# Patient Record
Sex: Female | Born: 1975
Health system: Southern US, Community
[De-identification: ages and names within clinical notes are randomized; demographics above are authoritative.]

## PROBLEM LIST (undated history)

## (undated) DIAGNOSIS — O24419 Gestational diabetes mellitus in pregnancy, unspecified control: Secondary | ICD-10-CM

## (undated) DIAGNOSIS — R030 Elevated blood-pressure reading, without diagnosis of hypertension: Secondary | ICD-10-CM

## (undated) DIAGNOSIS — E785 Hyperlipidemia, unspecified: Secondary | ICD-10-CM

## (undated) HISTORY — DX: Hyperlipidemia, unspecified: E78.5

## (undated) HISTORY — DX: Elevated blood-pressure reading, without diagnosis of hypertension: R03.0

## (undated) HISTORY — DX: Gestational diabetes mellitus in pregnancy, unspecified control: O24.419

---

## 2015-02-09 ENCOUNTER — Encounter: Payer: Self-pay | Admitting: Primary Care

## 2015-02-09 ENCOUNTER — Ambulatory Visit (INDEPENDENT_AMBULATORY_CARE_PROVIDER_SITE_OTHER): Payer: BLUE CROSS/BLUE SHIELD | Admitting: Primary Care

## 2015-02-09 VITALS — BP 138/92 | HR 90 | Temp 98.1°F | Ht 68.25 in | Wt 194.1 lb

## 2015-02-09 DIAGNOSIS — R030 Elevated blood-pressure reading, without diagnosis of hypertension: Secondary | ICD-10-CM

## 2015-02-09 DIAGNOSIS — E785 Hyperlipidemia, unspecified: Secondary | ICD-10-CM

## 2015-02-09 DIAGNOSIS — Z131 Encounter for screening for diabetes mellitus: Secondary | ICD-10-CM | POA: Diagnosis not present

## 2015-02-09 DIAGNOSIS — R7303 Prediabetes: Secondary | ICD-10-CM | POA: Insufficient documentation

## 2015-02-09 LAB — HEMOGLOBIN A1C: HEMOGLOBIN A1C: 5.6 % (ref 4.6–6.5)

## 2015-02-09 NOTE — Progress Notes (Signed)
Subjective:    Patient ID: Holly [name], female    DOB: [birth year], 39 y.o.   MRN: [id number]  HPI  Holly [name] is a 39 year old female who presents today to establish care and discuss the problems mentioned below. Will obtain old records. She presents today with lab work (drawn 01/08/15) with her occupation (CBC, CMP, TSH, Lipid Panel)  1) Gestational Diabetes: Delivered October 2015. She's not been re-evaluated since delivery, but glucose on lab work is 88 fasting. She has a strong FH of diabetes and would like to be evaluated. She denies numbness/tingling to fingers or toes, polyuria, polydipsia, polyphagia, weakness, dizziness.   2) Hyperlipidemia: Lab work displays elevated cholesterol and LDL. Her diet consists of fast food, eating out, "junk food" with limited fruits and vegetables. She's not a big dessert eater. She mostly drinks juice, diet soda, regular sodas with one bottle of water daily. She began exercising and started a "squat challenge" 2 weeks ago. She does have access to gym and plans on attending more frequently.   3) Elevated Blood Pressure Readings: One year ago was told her BP was elevated. She does not recall having elevated blood pressure during her last pregnancy. She denies chest pain today, but does have a history of chest pain in the past and cannot remember exact symptoms. Occasional headaches when experiencing stress at work.  BP Readings from Last 3 Encounters:  02/09/15 138/92     Review of Systems  Constitutional: Negative for fatigue and unexpected weight change.  HENT: Negative for rhinorrhea.   Respiratory: Negative for cough and shortness of breath.   Cardiovascular: Negative for chest pain.  Gastrointestinal: Negative for diarrhea and constipation.  Genitourinary: Negative for dysuria and frequency.  Musculoskeletal: Negative for myalgias and arthralgias.  Skin: Negative for rash.  Allergic/Immunologic: Negative for environmental allergies.    Neurological: Negative for numbness.       Occasional headaches.  Hematological: Negative for adenopathy.  Psychiatric/Behavioral:       Denies concerns for anxiety or depression.       Past Medical History  Diagnosis Date  . Gestational diabetes   . Hyperlipidemia   . Elevated blood pressure (not hypertension)     History   Social History  . Marital Status: Married    Spouse Name: N/A  . Number of Children: N/A  . Years of Education: N/A   Occupational History  . Not on file.   Social History Main Topics  . Smoking status: Never Smoker   . Smokeless tobacco: Not on file  . Alcohol Use: 0.0 oz/week    0 Standard drinks or equivalent per week     Comment: social  . Drug Use: Not on file  . Sexual Activity: Not on file   Other Topics Concern  . Not on file   Social History Narrative   Married.   Works in Clinical biochemistcustomer service.   Three girls.   [place]ives in [place]   Enjoys relaxing.       History reviewed. No pertinent past surgical history.  Family History  Problem Relation Age of Onset  . Arthritis Mother   . Hypertension Mother   . Cancer Paternal Grandmother     Colon  . Stroke Paternal Grandmother   . Diabetes Father   . Diabetes Paternal Grandmother     No Known Allergies  No current outpatient prescriptions on file prior to visit.   No current facility-administered medications on file prior to visit.  BP 138/92 mmHg  Pulse 90  Temp(Src) 98.1 F (36.7 C) (Oral)  Ht 5' 8.25" (1.734 m)  Wt 194 lb 1.9 oz (88.052 kg)  BMI 29.28 kg/m2  SpO2 98%    Objective:   Physical Exam  Constitutional: She is oriented to person, place, and time. She appears well-developed.  HENT:  Right Ear: Tympanic membrane and ear canal normal.  Left Ear: Tympanic membrane and ear canal normal.  Nose: Nose normal.  Mouth/Throat: Oropharynx is clear and moist.  Eyes: EOM are normal. Pupils are equal, round, and reactive to light.  Neck: Neck supple. No  thyromegaly present.  Cardiovascular: Normal rate and regular rhythm.   Pulmonary/Chest: Effort normal and breath sounds normal.  Abdominal: Soft. Bowel sounds are normal.  Musculoskeletal: Normal range of motion.  Lymphadenopathy:    She has no cervical adenopathy.  Neurological: She is alert and oriented to person, place, and time. She has normal reflexes. No cranial nerve deficit. Coordination normal.  Skin: Skin is warm and dry.  Psychiatric: She has a normal mood and affect.          Assessment & Plan:

## 2015-02-09 NOTE — Assessment & Plan Note (Signed)
Slightly elevated today. Education provided that weight loss through healthy diet and exercise will help to reduce blood pressure. She is to follow up in one month for re-evaluation, if continued elevation, then will consider medication.

## 2015-02-09 NOTE — Progress Notes (Signed)
Pre visit review using our clinic review tool, if applicable. No additional management support is needed unless otherwise documented below in the visit note. 

## 2015-02-09 NOTE — Patient Instructions (Addendum)
Complete lab work prior to leaving today. I will notify you of your results. It is important that you improve your diet. Please limit fast food, juice, junk foot, etc. Increase your consumption of fresh fruits and vegetables. Be sure to drink plenty of water daily. Continue your efforts of exercise and be sure to get 30-45 min of moderate intensity exercise at least 5 days a week. Follow up in one month for re-evaluation of blood pressure. It was a pleasure to meet you today! Please don't hesitate to call me with any questions. Welcome to [facility]!

## 2015-02-09 NOTE — Assessment & Plan Note (Signed)
She presents with fasting labs drawn on 01/08/15. Total cholesterol 241, LDL 169, HDL 47. Unhealthy diet. We discussed the importance of a healthy diet and what that entails. She has started exercising and plans on continuing.  Follow up Lipid Panel in 6 months.

## 2015-02-09 NOTE — Assessment & Plan Note (Signed)
Reports gestational diabetes during last pregnancy. Her glucose on occupational labs report is 5888. Will screen today with A1C. Again, discussed importance of healthy diet and exercise.

## 2015-02-13 ENCOUNTER — Encounter: Payer: Self-pay | Admitting: Primary Care

## 2015-03-13 ENCOUNTER — Ambulatory Visit: Payer: BLUE CROSS/BLUE SHIELD | Admitting: Primary Care

## 2015-03-27 ENCOUNTER — Ambulatory Visit: Payer: BLUE CROSS/BLUE SHIELD | Admitting: Primary Care

## 2015-10-23 ENCOUNTER — Other Ambulatory Visit (HOSPITAL_COMMUNITY)
Admission: RE | Admit: 2015-10-23 | Discharge: 2015-10-23 | Disposition: A | Discharge status: Home / Self Care | Payer: BLUE CROSS/BLUE SHIELD | Source: Ambulatory Visit | Attending: Primary Care | Admitting: Primary Care

## 2015-10-23 ENCOUNTER — Encounter: Payer: Self-pay | Admitting: Primary Care

## 2015-10-23 ENCOUNTER — Ambulatory Visit (INDEPENDENT_AMBULATORY_CARE_PROVIDER_SITE_OTHER): Payer: BLUE CROSS/BLUE SHIELD | Admitting: Primary Care

## 2015-10-23 VITALS — BP 122/82 | HR 76 | Temp 98.6°F | Ht 68.0 in | Wt 194.8 lb

## 2015-10-23 DIAGNOSIS — Z124 Encounter for screening for malignant neoplasm of cervix: Secondary | ICD-10-CM

## 2015-10-23 DIAGNOSIS — N898 Other specified noninflammatory disorders of vagina: Secondary | ICD-10-CM | POA: Diagnosis not present

## 2015-10-23 DIAGNOSIS — Z1151 Encounter for screening for human papillomavirus (HPV): Secondary | ICD-10-CM | POA: Insufficient documentation

## 2015-10-23 DIAGNOSIS — E559 Vitamin D deficiency, unspecified: Secondary | ICD-10-CM

## 2015-10-23 DIAGNOSIS — E785 Hyperlipidemia, unspecified: Secondary | ICD-10-CM | POA: Diagnosis not present

## 2015-10-23 DIAGNOSIS — Z01419 Encounter for gynecological examination (general) (routine) without abnormal findings: Secondary | ICD-10-CM | POA: Insufficient documentation

## 2015-10-23 DIAGNOSIS — Z0000 Encounter for general adult medical examination without abnormal findings: Secondary | ICD-10-CM | POA: Diagnosis not present

## 2015-10-23 DIAGNOSIS — R030 Elevated blood-pressure reading, without diagnosis of hypertension: Secondary | ICD-10-CM

## 2015-10-23 LAB — CBC
HCT: 40.5 % (ref 36.0–46.0)
Hemoglobin: 13.1 g/dL (ref 12.0–15.0)
MCHC: 32.5 g/dL (ref 30.0–36.0)
MCV: 91.7 fl (ref 78.0–100.0)
PLATELETS: 204 10*3/uL (ref 150.0–400.0)
RBC: 4.41 Mil/uL (ref 3.87–5.11)
RDW: 12.6 % (ref 11.5–15.5)
WBC: 7.2 10*3/uL (ref 4.0–10.5)

## 2015-10-23 LAB — LIPID PANEL
CHOL/HDL RATIO: 5
CHOLESTEROL: 220 mg/dL — AB (ref 0–200)
HDL: 42.5 mg/dL (ref >39.00)
LDL CALC: 152 mg/dL — AB (ref 0–99)
NonHDL: 177.1
TRIGLYCERIDES: 124 mg/dL (ref 0.0–149.0)
VLDL: 24.8 mg/dL (ref 0.0–40.0)

## 2015-10-23 LAB — COMPREHENSIVE METABOLIC PANEL
ALBUMIN: 4.1 g/dL (ref 3.5–5.2)
ALT: 15 U/L (ref 0–35)
AST: 16 U/L (ref 0–37)
Alkaline Phosphatase: 55 U/L (ref 39–117)
BUN: 10 mg/dL (ref 6–23)
CALCIUM: 9.4 mg/dL (ref 8.4–10.5)
CO2: 30 meq/L (ref 19–32)
CREATININE: 0.95 mg/dL (ref 0.40–1.20)
Chloride: 101 mEq/L (ref 96–112)
GFR: 84.36 mL/min (ref >60.00)
Glucose, Bld: 98 mg/dL (ref 70–99)
POTASSIUM: 3.9 meq/L (ref 3.5–5.1)
SODIUM: 138 meq/L (ref 135–145)
Total Bilirubin: 0.7 mg/dL (ref 0.2–1.2)
Total Protein: 7.5 g/dL (ref 6.0–8.3)

## 2015-10-23 LAB — VITAMIN D 25 HYDROXY (VIT D DEFICIENCY, FRACTURES): VITD: 16.01 ng/mL — ABNORMAL LOW (ref 30.00–100.00)

## 2015-10-23 NOTE — Progress Notes (Signed)
Pre visit review using our clinic review tool, if applicable. No additional management support is needed unless otherwise documented below in the visit note. 

## 2015-10-23 NOTE — Patient Instructions (Addendum)
We will notify you of your pap results once received.   Complete lab work prior to leaving today. I will notify you of your results once received.   It is important that you improve your diet. Please limit carbohydrates in the form of white bread, rice, pasta, fast food, fried foods, sugary drinks, etc. Increase your consumption of fresh fruits and vegetables.  You need to consume about 2 liters of water daily.  Start exercising. You should be getting 1 hour of moderate intensity exercise 5 days weekly.  Follow up in 1 year for repeat physical or sooner if needed.  It was a pleasure to see you today!

## 2015-10-23 NOTE — Progress Notes (Signed)
Subjective:    Patient ID: Holly [name], female    DOB: [birth year], 40 y.o.   MRN: [id number]  HPI  Holly [name] is a 40 year old female who presents today for complete physical.  Immunizations: -Tetanus: Completed in October 2015 -Influenza: Declines   Diet: Endorses a fair diet Breakfast: Skips Lunch: Eats at 2-3 pm. Left overs. Dinner: Home cooked meals (chicken, pasta, little veggies, starches) Eats out 2-3 times weekly. Snacks: Occasionally, chips, junk food. Desserts: None Beverages: Juice, little water, diet sodas (1 can daily).  Exercise: She does not currently exercise. Eye exam: Completed 1 year ago. Dental exam: Completed October 2016 Pap Smear: Due today    Review of Systems  Constitutional: Negative for unexpected weight change.  HENT: Negative for rhinorrhea.   Respiratory: Negative for cough and shortness of breath.   Cardiovascular: Negative for chest pain.  Gastrointestinal: Negative for diarrhea and constipation.  Genitourinary:       IUD in place. Will spot occasionally.   Musculoskeletal: Negative for myalgias and arthralgias.  Skin: Negative for rash.  Allergic/Immunologic: Negative for environmental allergies.  Neurological: Negative for dizziness, numbness and headaches.  Psychiatric/Behavioral:       Denies concerns for anxiety or depression        Past Medical History  Diagnosis Date  . Gestational diabetes   . Hyperlipidemia   . Elevated blood pressure (not hypertension)     Social History   Social History  . Marital Status: Married    Spouse Name: N/A  . Number of Children: N/A  . Years of Education: N/A   Occupational History  . Not on file.   Social History Main Topics  . Smoking status: Never Smoker   . Smokeless tobacco: Not on file  . Alcohol Use: 0.0 oz/week    0 Standard drinks or equivalent per week     Comment: social  . Drug Use: Not on file  . Sexual Activity: Not on file   Other Topics Concern  . Not on  file   Social History Narrative   Married.   Works in Clinical biochemistcustomer service.   Three girls.   [place]ives in [place]   Enjoys relaxing.       No past surgical history on file.  Family History  Problem Relation Age of Onset  . Arthritis Mother   . Hypertension Mother   . Cancer Paternal Grandmother     Colon  . Stroke Paternal Grandmother   . Diabetes Father   . Diabetes Paternal Grandmother     No Known Allergies  No current outpatient prescriptions on file prior to visit.   No current facility-administered medications on file prior to visit.    BP 122/82 mmHg  Pulse 76  Temp(Src) 98.6 F (37 C) (Oral)  Ht 5\' 8"  (1.727 m)  Wt 194 lb 12.8 oz (88.361 kg)  BMI 29.63 kg/m2  SpO2 96%    Objective:   Physical Exam  Constitutional: She is oriented to person, place, and time. She appears well-nourished.  HENT:  Right Ear: Tympanic membrane and ear canal normal.  Left Ear: Tympanic membrane and ear canal normal.  Nose: Nose normal.  Mouth/Throat: Oropharynx is clear and moist.  Eyes: Conjunctivae and EOM are normal. Pupils are equal, round, and reactive to light.  Neck: Neck supple. No thyromegaly present.  Cardiovascular: Normal rate and regular rhythm.   No murmur heard. Pulmonary/Chest: Effort normal and breath sounds normal. She has no rales.  Abdominal: Soft. Bowel  sounds are normal. There is no tenderness.  Genitourinary: Vagina normal. Cervix exhibits discharge. Cervix exhibits no motion tenderness.  Moderate amount of whitish discharge to cervix.  Musculoskeletal: Normal range of motion.  Lymphadenopathy:    She has no cervical adenopathy.  Neurological: She is alert and oriented to person, place, and time. She has normal reflexes. No cranial nerve deficit.  Skin: Skin is warm and dry. No rash noted.  Psychiatric: She has a normal mood and affect.          Assessment & Plan:

## 2015-10-24 DIAGNOSIS — Z0000 Encounter for general adult medical examination without abnormal findings: Secondary | ICD-10-CM | POA: Insufficient documentation

## 2015-10-24 DIAGNOSIS — Z0001 Encounter for general adult medical examination with abnormal findings: Secondary | ICD-10-CM | POA: Insufficient documentation

## 2015-10-24 DIAGNOSIS — E559 Vitamin D deficiency, unspecified: Secondary | ICD-10-CM | POA: Insufficient documentation

## 2015-10-24 LAB — WET PREP BY MOLECULAR PROBE
CANDIDA SPECIES: NEGATIVE
Gardnerella vaginalis: NEGATIVE
TRICHOMONAS VAG: NEGATIVE

## 2015-10-24 MED ORDER — VITAMIN D (ERGOCALCIFEROL) 1.25 MG (50000 UNIT) PO CAPS: ORAL_CAPSULE | ORAL | Status: DC

## 2015-10-24 NOTE — Assessment & Plan Note (Signed)
Improved since last measurement.  Will have her continue to work on healthy diet and exercise.  Repeat in 1 year.

## 2015-10-24 NOTE — Assessment & Plan Note (Signed)
Tdap UTD. Declines Flu. Pap completed during exam today. Results pending. Labs with improvement in hyperlipidemia and vitamin D deficiency. Exam with moderate whitish vaginal discharge, wet prep sent off; otherwise exam unremarkable. Discussed the importance of a healthy diet and regular exercise in order for weight loss and to reduce risk of other medical diseases. Follow up in 1 year for repeat physical.

## 2015-10-24 NOTE — Assessment & Plan Note (Signed)
Stable in clinic. Will continue to monitor.

## 2015-10-24 NOTE — Assessment & Plan Note (Signed)
Level of 16 from recent labs. Start vitamin D 50,000 unit capsules once weekly. Repeat labs in 3 months.

## 2015-10-27 LAB — CYTOLOGY - PAP

## 2016-09-29 ENCOUNTER — Ambulatory Visit: Payer: BLUE CROSS/BLUE SHIELD | Admitting: Speech Pathology

## 2017-01-31 DIAGNOSIS — J3089 Other allergic rhinitis: Secondary | ICD-10-CM | POA: Diagnosis not present

## 2017-02-03 ENCOUNTER — Other Ambulatory Visit: Payer: Self-pay | Admitting: Primary Care

## 2017-02-03 ENCOUNTER — Other Ambulatory Visit (INDEPENDENT_AMBULATORY_CARE_PROVIDER_SITE_OTHER): Payer: BLUE CROSS/BLUE SHIELD

## 2017-02-03 DIAGNOSIS — E559 Vitamin D deficiency, unspecified: Secondary | ICD-10-CM | POA: Diagnosis not present

## 2017-02-03 DIAGNOSIS — E785 Hyperlipidemia, unspecified: Secondary | ICD-10-CM

## 2017-02-03 LAB — COMPREHENSIVE METABOLIC PANEL
ALBUMIN: 3.9 g/dL (ref 3.5–5.2)
ALK PHOS: 53 U/L (ref 39–117)
ALT: 11 U/L (ref 0–35)
AST: 15 U/L (ref 0–37)
BILIRUBIN TOTAL: 0.2 mg/dL (ref 0.2–1.2)
BUN: 21 mg/dL (ref 6–23)
CO2: 27 mEq/L (ref 19–32)
Calcium: 9.6 mg/dL (ref 8.4–10.5)
Chloride: 107 mEq/L (ref 96–112)
Creatinine, Ser: 0.85 mg/dL (ref 0.40–1.20)
GFR: 95.28 mL/min (ref >60.00)
Glucose, Bld: 102 mg/dL — ABNORMAL HIGH (ref 70–99)
Potassium: 4.1 mEq/L (ref 3.5–5.1)
Sodium: 141 mEq/L (ref 135–145)
TOTAL PROTEIN: 7.2 g/dL (ref 6.0–8.3)

## 2017-02-03 LAB — VITAMIN D 25 HYDROXY (VIT D DEFICIENCY, FRACTURES): VITD: 16.53 ng/mL — AB (ref 30.00–100.00)

## 2017-02-03 LAB — LIPID PANEL
CHOLESTEROL: 221 mg/dL — AB (ref 0–200)
HDL: 44 mg/dL (ref >39.00)
LDL Cholesterol: 154 mg/dL — ABNORMAL HIGH (ref 0–99)
NONHDL: 176.65
Total CHOL/HDL Ratio: 5
Triglycerides: 113 mg/dL (ref 0.0–149.0)
VLDL: 22.6 mg/dL (ref 0.0–40.0)

## 2017-02-06 ENCOUNTER — Ambulatory Visit (INDEPENDENT_AMBULATORY_CARE_PROVIDER_SITE_OTHER): Payer: BLUE CROSS/BLUE SHIELD | Admitting: Primary Care

## 2017-02-06 ENCOUNTER — Encounter: Payer: Self-pay | Admitting: Primary Care

## 2017-02-06 VITALS — BP 136/82 | HR 89 | Temp 98.3°F | Ht 68.25 in | Wt 189.4 lb

## 2017-02-06 DIAGNOSIS — Z0000 Encounter for general adult medical examination without abnormal findings: Secondary | ICD-10-CM

## 2017-02-06 DIAGNOSIS — Z131 Encounter for screening for diabetes mellitus: Secondary | ICD-10-CM

## 2017-02-06 DIAGNOSIS — E785 Hyperlipidemia, unspecified: Secondary | ICD-10-CM

## 2017-02-06 DIAGNOSIS — Z1231 Encounter for screening mammogram for malignant neoplasm of breast: Secondary | ICD-10-CM

## 2017-02-06 DIAGNOSIS — R739 Hyperglycemia, unspecified: Secondary | ICD-10-CM

## 2017-02-06 DIAGNOSIS — R030 Elevated blood-pressure reading, without diagnosis of hypertension: Secondary | ICD-10-CM | POA: Diagnosis not present

## 2017-02-06 DIAGNOSIS — E559 Vitamin D deficiency, unspecified: Secondary | ICD-10-CM | POA: Diagnosis not present

## 2017-02-06 DIAGNOSIS — Z113 Encounter for screening for infections with a predominantly sexual mode of transmission: Secondary | ICD-10-CM | POA: Diagnosis not present

## 2017-02-06 DIAGNOSIS — Z1239 Encounter for other screening for malignant neoplasm of breast: Secondary | ICD-10-CM

## 2017-02-06 LAB — HEMOGLOBIN A1C: Hgb A1c MFr Bld: 5.8 % (ref 4.6–6.5)

## 2017-02-06 MED ORDER — VITAMIN D (ERGOCALCIFEROL) 1.25 MG (50000 UNIT) PO CAPS
ORAL_CAPSULE | ORAL | 0 refills | Status: DC
Start: 2017-02-06 — End: 2018-03-06

## 2017-02-06 NOTE — Progress Notes (Signed)
Pre visit review using our clinic review tool, if applicable. No additional management support is needed unless otherwise documented below in the visit note. 

## 2017-02-06 NOTE — Assessment & Plan Note (Signed)
Level at 16, repeat vitamin D 50,000 unit dose for 3 months.

## 2017-02-06 NOTE — Patient Instructions (Signed)
Your cholesterol is too high, work on diet and exercise.  Call the [facility] to schedule your mammogram.  Complete lab work prior to leaving today. I will notify you of your results once received.   Continue exercising. You should be getting 150 minutes of moderate intensity exercise weekly.  It's important to improve your diet by reducing consumption of fast food, fried food, processed snack foods, sugary drinks. Increase consumption of fresh vegetables and fruits, whole grains, water.  Ensure you are drinking 64 ounces of water daily.  Schedule a lab only appointment in three months to repeat vitamin D levels.  Follow up in 1 year for your annual exam.   High Cholesterol High cholesterol is a condition in which the blood has high levels of a white, waxy, fat-like substance (cholesterol). The human body needs small amounts of cholesterol. The liver makes all the cholesterol that the body needs. Extra (excess) cholesterol comes from the food that we eat. Cholesterol is carried from the liver by the blood through the blood vessels. If you have high cholesterol, deposits (plaques) may build up on the walls of your blood vessels (arteries). Plaques make the arteries narrower and stiffer. Cholesterol plaques increase your risk for heart attack and stroke. Work with your health care provider to keep your cholesterol levels in a healthy range. What increases the risk? This condition is more likely to develop in people who:  Eat foods that are high in animal fat (saturated fat) or cholesterol.  Are overweight.  Are not getting enough exercise.  Have a family history of high cholesterol. What are the signs or symptoms? There are no symptoms of this condition. How is this diagnosed? This condition may be diagnosed from the results of a blood test.  If you are older than age 79, your health care provider may check your cholesterol every 4-6 years.  You may be checked more  often if you already have high cholesterol or other risk factors for heart disease. The blood test for cholesterol measures:  "Bad" cholesterol (LDL cholesterol). This is the main type of cholesterol that causes heart disease. The desired level for LDL is less than 100.  "Good" cholesterol (HDL cholesterol). This type helps to protect against heart disease by cleaning the arteries and carrying the LDL away. The desired level for HDL is 60 or higher.  Triglycerides. These are fats that the body can store or burn for energy. The desired number for triglycerides is lower than 150.  Total cholesterol. This is a measure of the total amount of cholesterol in your blood, including LDL cholesterol, HDL cholesterol, and triglycerides. A healthy number is less than 200. How is this treated? This condition is treated with diet changes, lifestyle changes, and medicines. Diet changes   This may include eating more whole grains, fruits, vegetables, nuts, and fish.  This may also include cutting back on red meat and foods that have a lot of added sugar. Lifestyle changes   Changes may include getting at least 40 minutes of aerobic exercise 3 times a week. Aerobic exercises include walking, biking, and swimming. Aerobic exercise along with a healthy diet can help you maintain a healthy weight.  Changes may also include quitting smoking. Medicines   Medicines are usually given if diet and lifestyle changes have failed to reduce your cholesterol to healthy levels.  Your health care provider may prescribe a statin medicine. Statin medicines have been shown to reduce cholesterol, which can reduce the risk of  heart disease. Follow these instructions at home: Eating and drinking   If told by your health care provider:  Eat chicken (without skin), fish, veal, shellfish, ground [place] breast, and round or loin cuts of red meat.  Do not eat fried foods or fatty meats, such as hot dogs and salami.  Eat  plenty of fruits, such as apples.  Eat plenty of vegetables, such as broccoli, potatoes, and carrots.  Eat beans, peas, and lentils.  Eat grains such as barley, rice, couscous, and bulgur wheat.  Eat pasta without cream sauces.  Use skim or nonfat milk, and eat low-fat or nonfat yogurt and cheeses.  Do not eat or drink whole milk, cream, ice cream, egg yolks, or hard cheeses.  Do not eat stick margarine or tub margarines that contain trans fats (also called partially hydrogenated oils).  Do not eat saturated tropical oils, such as coconut oil and palm oil.  Do not eat cakes, cookies, crackers, or other baked goods that contain trans fats. General instructions   Exercise as directed by your health care provider. Increase your activity level with activities such as gardening, walking, and taking the stairs.  Take over-the-counter and prescription medicines only as told by your health care provider.  Do not use any products that contain nicotine or tobacco, such as cigarettes and e-cigarettes. If you need help quitting, ask your health care provider.  Keep all follow-up visits as told by your health care provider. This is important. Contact a health care provider if:  You are struggling to maintain a healthy diet or weight.  You need help to start on an exercise program.  You need help to stop smoking. Get help right away if:  You have chest pain.  You have trouble breathing. This information is not intended to replace advice given to you by your health care provider. Make sure you discuss any questions you have with your health care provider. Document Released: 09/26/2005 Document Revised: 04/23/2016 Document Reviewed: 03/26/2016 Elsevier Interactive Patient Education  2017 [employer].

## 2017-02-06 NOTE — Assessment & Plan Note (Signed)
Will allow her to work on diet and exercise. Discussed she MUST improve both. Will continue to monitor lipids.

## 2017-02-06 NOTE — Assessment & Plan Note (Addendum)
Immunizations UTD. Pap UTD. Screening mammogram due this year, ordered and pending. Discussed the importance of a healthy diet and regular exercise in order for weight loss, and to reduce the risk of other medical diseases. Exam unremarkable. Labs with hyperglycemia and hyperlipidemia which were addressed. STD screening pending. Follow up in 1 year.

## 2017-02-06 NOTE — Assessment & Plan Note (Signed)
Noted hyperglycemia on recent labs.  Check A1C. Discussed the importance of a healthy diet and regular exercise in order for weight loss, and to reduce the risk of other medical diseases.

## 2017-02-06 NOTE — Progress Notes (Signed)
Subjective:    Patient ID: Holly [name], female    DOB: 1976-04-15, 41 y.o.   MRN: [id number]  HPI  Holly [name] is a 41 year old female who presents today for complete physical.  Immunizations: -Tetanus: Completed in 2015 -Influenza: Did not receive last season   Diet: She endorses a fair diet. Breakfast: Skips Lunch: Skips sometimes, Fast food Dinner: Chicken, shrimp, vegetables Snacks: Chips Desserts: Occasionally  Beverages: Juice, water, some soda  Exercise: Recently joined a gym through her occupation, goes three times weekly for 30 minutes. Eye exam: Completed 2 years ago. Dental exam: Completes annually Pap Smear: Completed in 2017, normal.  Mammogram: Due.   Review of Systems  Constitutional: Negative for unexpected weight change.  HENT: Negative for rhinorrhea.   Respiratory: Negative for cough and shortness of breath.   Cardiovascular: Negative for chest pain.  Gastrointestinal: Negative for constipation and diarrhea.  Genitourinary: Negative for difficulty urinating and menstrual problem.       Occasional spotting  Musculoskeletal: Negative for arthralgias and myalgias.  Skin: Negative for rash.  Allergic/Immunologic: Negative for environmental allergies.  Neurological: Negative for dizziness, numbness and headaches.  Psychiatric/Behavioral:       She denies concerns for anxiety or depression       Past Medical History:  Diagnosis Date  . Elevated blood pressure (not hypertension)   . Gestational diabetes   . Hyperlipidemia      Social History   Social History  . Marital status: Married    Spouse name: N/A  . Number of children: N/A  . Years of education: N/A   Occupational History  . Not on file.   Social History Main Topics  . Smoking status: Never Smoker  . Smokeless tobacco: Never Used  . Alcohol use 0.0 oz/week     Comment: social  . Drug use: No  . Sexual activity: Not on file   Other Topics Concern  . Not on file   Social  History Narrative   Married.   Works in Clinical biochemist.   Three girls.   Lives in [place]   Enjoys relaxing.       No past surgical history on file.  Family History  Problem Relation Age of Onset  . Arthritis Mother   . Hypertension Mother   . Diabetes Father   . Cancer Paternal Grandmother     Colon  . Stroke Paternal Grandmother   . Diabetes Paternal Grandmother     No Known Allergies  No current outpatient prescriptions on file prior to visit.   No current facility-administered medications on file prior to visit.     BP 136/82   Pulse 89   Temp 98.3 F (36.8 C) (Oral)   Ht 5' 8.25" (1.734 m)   Wt 189 lb 6.4 oz (85.9 kg)   SpO2 98%   BMI 28.59 kg/m    Objective:   Physical Exam  Constitutional: She is oriented to person, place, and time. She appears well-nourished.  HENT:  [name] Ear: Tympanic membrane and ear canal normal.  Left Ear: Tympanic membrane and ear canal normal.  Nose: Nose normal.  Mouth/Throat: Oropharynx is clear and moist.  Eyes: Conjunctivae and EOM are normal. Pupils are equal, round, and reactive to light.  Neck: Neck supple. No thyromegaly present.  Cardiovascular: Normal rate and regular rhythm.   No murmur heard. Pulmonary/Chest: Effort normal and breath sounds normal. She has no rales.  Abdominal: Soft. Bowel sounds are normal. There is no tenderness.  Musculoskeletal: Normal range of motion.  Lymphadenopathy:    She has no cervical adenopathy.  Neurological: She is alert and oriented to person, place, and time. She has normal reflexes. No cranial nerve deficit.  Skin: Skin is warm and dry. No rash noted.  Psychiatric: She has a normal mood and affect.          Assessment & Plan:

## 2017-02-06 NOTE — Assessment & Plan Note (Signed)
Stable today, continue to monitor.

## 2017-02-07 LAB — GC/CHLAMYDIA PROBE AMP
CT Probe RNA: NOT DETECTED
GC PROBE AMP APTIMA: NOT DETECTED

## 2017-02-07 LAB — RPR

## 2017-02-07 LAB — HIV ANTIBODY (ROUTINE TESTING W REFLEX): HIV: NONREACTIVE

## 2017-02-09 LAB — HSV TYPE I/II IGG, IGMW/ REFLEX
HSV 1 Glycoprotein G Ab, IgG: 39.5 Index — ABNORMAL HIGH (ref <0.90)
HSV 1 IgM Screen: NEGATIVE
HSV 2 Glycoprotein G Ab, IgG: 12.9 Index — ABNORMAL HIGH (ref <0.90)
HSV 2 IGM SCREEN: NEGATIVE

## 2017-04-18 ENCOUNTER — Other Ambulatory Visit: Payer: Self-pay | Admitting: Primary Care

## 2017-04-18 DIAGNOSIS — E559 Vitamin D deficiency, unspecified: Secondary | ICD-10-CM

## 2017-04-26 ENCOUNTER — Other Ambulatory Visit: Payer: Self-pay | Admitting: Primary Care

## 2017-04-26 DIAGNOSIS — R7303 Prediabetes: Secondary | ICD-10-CM

## 2017-04-26 DIAGNOSIS — E785 Hyperlipidemia, unspecified: Secondary | ICD-10-CM

## 2017-04-26 DIAGNOSIS — E559 Vitamin D deficiency, unspecified: Secondary | ICD-10-CM

## 2017-05-08 ENCOUNTER — Other Ambulatory Visit: Payer: BLUE CROSS/BLUE SHIELD

## 2017-08-08 ENCOUNTER — Other Ambulatory Visit: Payer: BLUE CROSS/BLUE SHIELD

## 2017-08-14 ENCOUNTER — Other Ambulatory Visit (INDEPENDENT_AMBULATORY_CARE_PROVIDER_SITE_OTHER): Payer: BLUE CROSS/BLUE SHIELD

## 2017-08-14 DIAGNOSIS — R7303 Prediabetes: Secondary | ICD-10-CM

## 2017-08-14 DIAGNOSIS — E785 Hyperlipidemia, unspecified: Secondary | ICD-10-CM

## 2017-08-14 DIAGNOSIS — E559 Vitamin D deficiency, unspecified: Secondary | ICD-10-CM | POA: Diagnosis not present

## 2017-08-14 LAB — LIPID PANEL
Cholesterol: 201 mg/dL — ABNORMAL HIGH (ref 0–200)
HDL: 43.4 mg/dL (ref >39.00)
LDL Cholesterol: 133 mg/dL — ABNORMAL HIGH (ref 0–99)
NONHDL: 157.76
TRIGLYCERIDES: 123 mg/dL (ref 0.0–149.0)
Total CHOL/HDL Ratio: 5
VLDL: 24.6 mg/dL (ref 0.0–40.0)

## 2017-08-14 LAB — VITAMIN D 25 HYDROXY (VIT D DEFICIENCY, FRACTURES): VITD: 28.55 ng/mL — ABNORMAL LOW (ref 30.00–100.00)

## 2017-08-14 LAB — HEMOGLOBIN A1C: Hgb A1c MFr Bld: 5.8 % (ref 4.6–6.5)

## 2018-02-20 ENCOUNTER — Other Ambulatory Visit: Payer: Self-pay | Admitting: Primary Care

## 2018-02-20 DIAGNOSIS — E559 Vitamin D deficiency, unspecified: Secondary | ICD-10-CM

## 2018-02-20 DIAGNOSIS — R7303 Prediabetes: Secondary | ICD-10-CM

## 2018-02-20 DIAGNOSIS — E782 Mixed hyperlipidemia: Secondary | ICD-10-CM

## 2018-02-26 ENCOUNTER — Other Ambulatory Visit (INDEPENDENT_AMBULATORY_CARE_PROVIDER_SITE_OTHER): Payer: BLUE CROSS/BLUE SHIELD

## 2018-02-26 DIAGNOSIS — E782 Mixed hyperlipidemia: Secondary | ICD-10-CM | POA: Diagnosis not present

## 2018-02-26 DIAGNOSIS — E559 Vitamin D deficiency, unspecified: Secondary | ICD-10-CM | POA: Diagnosis not present

## 2018-02-26 DIAGNOSIS — R7303 Prediabetes: Secondary | ICD-10-CM

## 2018-02-26 LAB — COMPREHENSIVE METABOLIC PANEL
ALT: 15 U/L (ref 0–35)
AST: 13 U/L (ref 0–37)
Albumin: 3.7 g/dL (ref 3.5–5.2)
Alkaline Phosphatase: 44 U/L (ref 39–117)
BILIRUBIN TOTAL: 0.3 mg/dL (ref 0.2–1.2)
BUN: 12 mg/dL (ref 6–23)
CO2: 26 mEq/L (ref 19–32)
CREATININE: 0.83 mg/dL (ref 0.40–1.20)
Calcium: 9 mg/dL (ref 8.4–10.5)
Chloride: 105 mEq/L (ref 96–112)
GFR: 97.42 mL/min (ref >60.00)
GLUCOSE: 124 mg/dL — AB (ref 70–99)
Potassium: 3.9 mEq/L (ref 3.5–5.1)
Sodium: 138 mEq/L (ref 135–145)
TOTAL PROTEIN: 7 g/dL (ref 6.0–8.3)

## 2018-02-26 LAB — HEMOGLOBIN A1C: Hgb A1c MFr Bld: 5.7 % (ref 4.6–6.5)

## 2018-02-26 LAB — LIPID PANEL
CHOL/HDL RATIO: 4
Cholesterol: 204 mg/dL — ABNORMAL HIGH (ref 0–200)
HDL: 47.5 mg/dL (ref >39.00)
LDL Cholesterol: 132 mg/dL — ABNORMAL HIGH (ref 0–99)
NonHDL: 156.9
TRIGLYCERIDES: 125 mg/dL (ref 0.0–149.0)
VLDL: 25 mg/dL (ref 0.0–40.0)

## 2018-02-26 LAB — VITAMIN D 25 HYDROXY (VIT D DEFICIENCY, FRACTURES): VITD: 20.24 ng/mL — AB (ref 30.00–100.00)

## 2018-03-06 ENCOUNTER — Ambulatory Visit (INDEPENDENT_AMBULATORY_CARE_PROVIDER_SITE_OTHER): Payer: BLUE CROSS/BLUE SHIELD | Admitting: Primary Care

## 2018-03-06 ENCOUNTER — Encounter: Payer: Self-pay | Admitting: Primary Care

## 2018-03-06 VITALS — BP 130/86 | HR 81 | Temp 98.5°F | Ht 68.25 in | Wt 190.2 lb

## 2018-03-06 DIAGNOSIS — Z0000 Encounter for general adult medical examination without abnormal findings: Secondary | ICD-10-CM | POA: Diagnosis not present

## 2018-03-06 DIAGNOSIS — Z1231 Encounter for screening mammogram for malignant neoplasm of breast: Secondary | ICD-10-CM | POA: Diagnosis not present

## 2018-03-06 DIAGNOSIS — Z1239 Encounter for other screening for malignant neoplasm of breast: Secondary | ICD-10-CM

## 2018-03-06 DIAGNOSIS — R7303 Prediabetes: Secondary | ICD-10-CM | POA: Diagnosis not present

## 2018-03-06 DIAGNOSIS — E785 Hyperlipidemia, unspecified: Secondary | ICD-10-CM | POA: Diagnosis not present

## 2018-03-06 DIAGNOSIS — E559 Vitamin D deficiency, unspecified: Secondary | ICD-10-CM

## 2018-03-06 DIAGNOSIS — R030 Elevated blood-pressure reading, without diagnosis of hypertension: Secondary | ICD-10-CM

## 2018-03-06 NOTE — Assessment & Plan Note (Signed)
Stable in the office today, continue to monitor.  

## 2018-03-06 NOTE — Progress Notes (Signed)
Subjective:    Patient ID: Greig Right, female    DOB: Apr 30, 1976, 42 y.o.   MRN: [id number]  HPI  Ms. Pun is a 42 year old female who presents today for complete physical.   Immunizations: -Tetanus: Completed in 2015  Diet: She endorses a fair diet. Breakfast: Skips mostly Lunch: Skips mostly Dinner: Fast food, baked chicken, some vegetables, sweet potatoes Snacks: None Desserts: None Beverages: Water, flavored water, little soda and juice, some sweet tea  Exercise: She is walking on the treadmill for 20 minutes, 2-3 days weekly Eye exam: Completed in early 2019 Dental exam: No recent exam Pap Smear: Completed in 2017 Mammogram: Due this year.  Wt Readings from Last 3 Encounters:  03/06/18 190 lb 4 oz (86.3 kg)  02/06/17 189 lb 6.4 oz (85.9 kg)  10/23/15 194 lb 12.8 oz (88.4 kg)    BP Readings from Last 3 Encounters:  03/06/18 130/86  02/06/17 136/82  10/23/15 122/82     Review of Systems  Constitutional: Negative for unexpected weight change.  HENT: Negative for rhinorrhea.   Respiratory: Negative for cough and shortness of breath.   Cardiovascular: Negative for chest pain.  Gastrointestinal: Negative for constipation and diarrhea.  Genitourinary: Negative for difficulty urinating and menstrual problem.  Musculoskeletal: Negative for arthralgias and myalgias.  Skin: Negative for rash.  Allergic/Immunologic: Negative for environmental allergies.  Neurological: Negative for dizziness, numbness and headaches.  Psychiatric/Behavioral: The patient is not nervous/anxious.        Past Medical History:  Diagnosis Date  . Elevated blood pressure (not hypertension)   . Gestational diabetes   . Hyperlipidemia      Social History   Socioeconomic History  . Marital status: Married    Spouse name: Not on file  . Number of children: Not on file  . Years of education: Not on file  . Highest education level: Not on file  Occupational History  . Not on file   Social Needs  . Financial resource strain: Not on file  . Food insecurity:    Worry: Not on file    Inability: Not on file  . Transportation needs:    Medical: Not on file    Non-medical: Not on file  Tobacco Use  . Smoking status: Never Smoker  . Smokeless tobacco: Never Used  Substance and Sexual Activity  . Alcohol use: Yes    Alcohol/week: 0.0 oz    Comment: social  . Drug use: No  . Sexual activity: Not on file  Lifestyle  . Physical activity:    Days per week: Not on file    Minutes per session: Not on file  . Stress: Not on file  Relationships  . Social connections:    Talks on phone: Not on file    Gets together: Not on file    Attends religious service: Not on file    Active member of club or organization: Not on file    Attends meetings of clubs or organizations: Not on file    Relationship status: Not on file  . Intimate partner violence:    Fear of current or ex partner: Not on file    Emotionally abused: Not on file    Physically abused: Not on file    Forced sexual activity: Not on file  Other Topics Concern  . Not on file  Social History Narrative   Married.   Works in Clinical biochemist.   Three girls.   Lives in [place]  Enjoys relaxing.    No past surgical history on file.  Family History  Problem Relation Age of Onset  . Arthritis Mother   . Hypertension Mother   . Diabetes Father   . Cancer Paternal Grandmother        Colon  . Stroke Paternal Grandmother   . Diabetes Paternal Grandmother     No Known Allergies  No current outpatient medications on file prior to visit.   No current facility-administered medications on file prior to visit.     BP 130/86   Pulse 81   Temp 98.5 F (36.9 C) (Oral)   Ht 5' 8.25" (1.734 m)   Wt 190 lb 4 oz (86.3 kg)   SpO2 98%   BMI 28.72 kg/m    Objective:   Physical Exam  Constitutional: She is oriented to person, place, and time. She appears well-nourished.  HENT:  Mouth/Throat: No  oropharyngeal exudate.  Eyes: Pupils are equal, round, and reactive to light. EOM are normal.  Neck: Neck supple. No thyromegaly present.  Cardiovascular: Normal rate and regular rhythm.  Respiratory: Effort normal and breath sounds normal.  GI: Soft. Bowel sounds are normal. There is no tenderness.  Musculoskeletal: Normal range of motion.  Neurological: She is alert and oriented to person, place, and time.  Skin: Skin is warm and dry.  Psychiatric: She has a normal mood and affect.           Assessment & Plan:

## 2018-03-06 NOTE — Assessment & Plan Note (Signed)
Td UTD. Pap smear UTD, due in 2020. Mammogram due, pending. Commended her on lifestyle changes, encouraged to continue. Exam unremarkable. Labs improved from last year, continue to monitor.  Follow up in 1 year for CPE.

## 2018-03-06 NOTE — Patient Instructions (Addendum)
Call the [facility] to schedule your mammogram.  Start exercising. You should be getting 150 minutes of moderate intensity exercise weekly.  Continue to work on [employer] as discussed.  Ensure you are consuming 64 ounces of water daily.  You are due for your pap smear next year.  Start taking Vitamin D 2000 units once daily.  Follow up in 1 year for your annual exam or sooner if needed.  It was a pleasure to see you today!   Preventive Care 40-64 Years, Female Preventive care refers to lifestyle choices and visits with your health care provider that can promote health and wellness. What does preventive care include?  A yearly physical exam. This is also called an annual well check.  Dental exams once or twice a year.  Routine eye exams. Ask your health care provider how often you should have your eyes checked.  Personal lifestyle choices, including: ? Daily care of your teeth and gums. ? Regular physical activity. ? Eating a healthy diet. ? Avoiding tobacco and drug use. ? Limiting alcohol use. ? Practicing safe sex. ? Taking low-dose aspirin daily starting at age 46. ? Taking vitamin and mineral supplements as recommended by your health care provider. What happens during an annual well check? The services and screenings done by your health care provider during your annual well check will depend on your age, overall health, lifestyle risk factors, and family history of disease. Counseling Your health care provider may ask you questions about your:  Alcohol use.  Tobacco use.  Drug use.  Emotional well-being.  Home and relationship well-being.  Sexual activity.  Eating habits.  Work and work Statistician.  Method of birth control.  Menstrual cycle.  Pregnancy history.  Screening You may have the following tests or measurements:  Height, weight, and BMI.  Blood pressure.  Lipid and cholesterol levels. These may be checked every 5 years,  or more frequently if you are over 68 years old.  Skin check.  Lung cancer screening. You may have this screening every year starting at age 36 if you have a 30-pack-year history of smoking and currently smoke or have quit within the past 15 years.  Fecal occult blood test (FOBT) of the stool. You may have this test every year starting at age 79.  Flexible sigmoidoscopy or colonoscopy. You may have a sigmoidoscopy every 5 years or a colonoscopy every 10 years starting at age 75.  Hepatitis C blood test.  Hepatitis B blood test.  Sexually transmitted disease (STD) testing.  Diabetes screening. This is done by checking your blood sugar (glucose) after you have not eaten for a while (fasting). You may have this done every 1-3 years.  Mammogram. This may be done every 1-2 years. Talk to your health care provider about when you should start having regular mammograms. This may depend on whether you have a family history of breast cancer.  BRCA-related cancer screening. This may be done if you have a family history of breast, ovarian, tubal, or peritoneal cancers.  Pelvic exam and Pap test. This may be done every 3 years starting at age 22. Starting at age 4, this may be done every 5 years if you have a Pap test in combination with an HPV test.  Bone density scan. This is done to screen for osteoporosis. You may have this scan if you are at high risk for osteoporosis.  Discuss your test results, treatment options, and if necessary, the need for more tests  with your health care provider. Vaccines Your health care provider may recommend certain vaccines, such as:  Influenza vaccine. This is recommended every year.  Tetanus, diphtheria, and acellular pertussis (Tdap, Td) vaccine. You may need a Td booster every 10 years.  Varicella vaccine. You may need this if you have not been vaccinated.  Zoster vaccine. You may need this after age 31.  Measles, mumps, and rubella (MMR) vaccine. You  may need at least one dose of MMR if you were born in 1957 or later. You may also need a second dose.  Pneumococcal 13-valent conjugate (PCV13) vaccine. You may need this if you have certain conditions and were not previously vaccinated.  Pneumococcal polysaccharide (PPSV23) vaccine. You may need one or two doses if you smoke cigarettes or if you have certain conditions.  Meningococcal vaccine. You may need this if you have certain conditions.  Hepatitis A vaccine. You may need this if you have certain conditions or if you travel or work in places where you may be exposed to hepatitis A.  Hepatitis B vaccine. You may need this if you have certain conditions or if you travel or work in places where you may be exposed to hepatitis B.  Haemophilus influenzae type b (Hib) vaccine. You may need this if you have certain conditions.  Talk to your health care provider about which screenings and vaccines you need and how often you need them. This information is not intended to replace advice given to you by your health care provider. Make sure you discuss any questions you have with your health care provider. Document Released: 10/23/2015 Document Revised: 06/15/2016 Document Reviewed: 07/28/2015 Elsevier Interactive Patient Education  [employer].

## 2018-03-06 NOTE — Assessment & Plan Note (Signed)
Down to 20 on recent labs. Will have her start OTC 2000 units once daily.

## 2018-03-06 NOTE — Assessment & Plan Note (Signed)
Improved on recent labs when compared to last year. Commended her on improvements in her diet over the last month, encouraged her to continue.   Repeat lipids in one year.

## 2018-03-06 NOTE — Assessment & Plan Note (Signed)
Recent A1C improved to 5.7 from 5.8 last year. Commended her on healthy lifestyle changes, encouraged her to continue. Will continue to monitor A1C in the future.

## 2018-05-10 ENCOUNTER — Ambulatory Visit
Admission: RE | Admit: 2018-05-10 | Discharge: 2018-05-10 | Disposition: A | Discharge status: Home / Self Care | Payer: BLUE CROSS/BLUE SHIELD | Source: Ambulatory Visit | Attending: Primary Care | Admitting: Primary Care

## 2018-05-10 DIAGNOSIS — Z1231 Encounter for screening mammogram for malignant neoplasm of breast: Secondary | ICD-10-CM | POA: Diagnosis not present

## 2018-05-10 DIAGNOSIS — Z1239 Encounter for other screening for malignant neoplasm of breast: Secondary | ICD-10-CM

## 2018-08-16 ENCOUNTER — Encounter: Payer: Self-pay | Admitting: Primary Care

## 2018-08-16 ENCOUNTER — Ambulatory Visit: Payer: BLUE CROSS/BLUE SHIELD | Admitting: Primary Care

## 2018-08-16 VITALS — BP 126/82 | HR 79 | Temp 98.4°F | Ht 68.25 in | Wt 188.0 lb

## 2018-08-16 DIAGNOSIS — R7303 Prediabetes: Secondary | ICD-10-CM | POA: Diagnosis not present

## 2018-08-16 DIAGNOSIS — R350 Frequency of micturition: Secondary | ICD-10-CM

## 2018-08-16 LAB — POC URINALSYSI DIPSTICK (AUTOMATED)
Bilirubin, UA: NEGATIVE
Glucose, UA: NEGATIVE
KETONES UA: NEGATIVE
Leukocytes, UA: NEGATIVE
Nitrite, UA: NEGATIVE
PROTEIN UA: NEGATIVE
RBC UA: NEGATIVE
SPEC GRAV UA: 1.025 (ref 1.010–1.025)
Urobilinogen, UA: 0.2 E.U./dL
pH, UA: 6 (ref 5.0–8.0)

## 2018-08-16 LAB — POCT GLYCOSYLATED HEMOGLOBIN (HGB A1C): Hemoglobin A1C: 5.3 % (ref 4.0–5.6)

## 2018-08-16 NOTE — Progress Notes (Signed)
Subjective:    Patient ID: Holly [name], female    DOB: Mar 07, 1976, 42 y.o.   MRN: [id number]  HPI  Holly [name] is a 42 year old female with a history of prediabetes, urinary tract infection, who presents today with a chief complaint of urinary frequency.  She also reports left flank pain. Last week she noticed that her left side would bother her when twisting to her left side.   She denies foul smelling urine, hematuria, vaginal discharge, vaginal itching, dysuria, radiation of her pain. Her symptoms began one week ago. She's been drinking cranberry juice and has noticed some improvement.   Review of Systems  Constitutional: Negative for fever.  Gastrointestinal: Negative for abdominal pain and nausea.  Genitourinary: Positive for flank pain and frequency. Negative for difficulty urinating, dysuria, hematuria, pelvic pain and vaginal discharge.       Past Medical History:  Diagnosis Date  . Elevated blood pressure (not hypertension)   . Gestational diabetes   . Hyperlipidemia      Social History   Socioeconomic History  . Marital status: Married    Spouse name: Not on file  . Number of children: Not on file  . Years of education: Not on file  . Highest education level: Not on file  Occupational History  . Not on file  Social Needs  . Financial resource strain: Not on file  . Food insecurity:    Worry: Not on file    Inability: Not on file  . Transportation needs:    Medical: Not on file    Non-medical: Not on file  Tobacco Use  . Smoking status: Never Smoker  . Smokeless tobacco: Never Used  Substance and Sexual Activity  . Alcohol use: Yes    Alcohol/week: 0.0 standard drinks    Comment: social  . Drug use: No  . Sexual activity: Not on file  Lifestyle  . Physical activity:    Days per week: Not on file    Minutes per session: Not on file  . Stress: Not on file  Relationships  . Social connections:    Talks on phone: Not on file    Gets together: Not on  file    Attends religious service: Not on file    Active member of club or organization: Not on file    Attends meetings of clubs or organizations: Not on file    Relationship status: Not on file  . Intimate partner violence:    Fear of current or ex partner: Not on file    Emotionally abused: Not on file    Physically abused: Not on file    Forced sexual activity: Not on file  Other Topics Concern  . Not on file  Social History Narrative   Married.   Works in Clinical biochemist.   Three girls.   Lives in [place]   Enjoys relaxing.    No past surgical history on file.  Family History  Problem Relation Age of Onset  . Arthritis Mother   . Hypertension Mother   . Diabetes Father   . Cancer Paternal Grandmother        Colon  . Stroke Paternal Grandmother   . Diabetes Paternal Grandmother   . Breast cancer Neg Hx     No Known Allergies  No current outpatient medications on file prior to visit.   No current facility-administered medications on file prior to visit.     BP 126/82   Pulse 79  Temp 98.4 F (36.9 C) (Oral)   Ht 5' 8.25" (1.734 m)   Wt 188 lb (85.3 kg)   SpO2 98%   BMI 28.38 kg/m    Objective:   Physical Exam  Constitutional: She appears well-nourished.  Neck: Neck supple.  Cardiovascular: Normal rate and regular rhythm.  Respiratory: Effort normal and breath sounds normal.  Skin: Skin is warm and dry.           Assessment & Plan:  Urinary Frequency:  Also with flank pain. Exam today without CVA or abdominal tenderness. UA negative. Culture sent. Suspect MSK strain to left flank given negative UA and lack of other cardinal symptoms of renal stone. Start Ibuprofen PRN flank pain. Push intake of water. Check A1C given history of prediabetes with urinary frequency.  Doreene Nest, NP

## 2018-08-16 NOTE — Patient Instructions (Signed)
Stop by the lab prior to leaving today. I will notify you of your results once received.   Ensure you are consuming 64 ounces of water daily.  You may take Ibuprofen 600 mg every 8 hours as needed for pain.   It was a pleasure to see you today!

## 2018-08-17 LAB — URINE CULTURE
MICRO NUMBER: 91342696
SPECIMEN QUALITY: ADEQUATE

## 2018-12-05 ENCOUNTER — Encounter: Payer: Self-pay | Admitting: Primary Care

## 2018-12-05 ENCOUNTER — Ambulatory Visit (INDEPENDENT_AMBULATORY_CARE_PROVIDER_SITE_OTHER)
Admission: RE | Admit: 2018-12-05 | Discharge: 2018-12-05 | Disposition: A | Discharge status: Home / Self Care | Payer: BLUE CROSS/BLUE SHIELD | Source: Ambulatory Visit | Attending: Primary Care | Admitting: Primary Care

## 2018-12-05 ENCOUNTER — Ambulatory Visit: Payer: BLUE CROSS/BLUE SHIELD | Admitting: Primary Care

## 2018-12-05 VITALS — BP 130/86 | HR 84 | Temp 98.0°F | Ht 68.25 in | Wt 198.5 lb

## 2018-12-05 DIAGNOSIS — M25512 Pain in left shoulder: Secondary | ICD-10-CM | POA: Diagnosis not present

## 2018-12-05 DIAGNOSIS — G8929 Other chronic pain: Secondary | ICD-10-CM

## 2018-12-05 MED ORDER — DICLOFENAC SODIUM 75 MG PO TBEC: 75.0000 mg | DELAYED_RELEASE_TABLET | Freq: Two times a day (BID) | ORAL | 0 refills | Status: DC | PRN

## 2018-12-05 NOTE — Progress Notes (Signed)
Subjective:    Patient ID: Holly [name], female    DOB: [birth year], 43 y.o.   MRN: [id number]  HPI  Holly [name] is a 43 year old female who presents today with a chief complaint of shoulder pain.  Her pain is located to the left anterior shoulder with radiation to the left posterior shoulder. Her symptoms began over one year ago thinks this was due to repetitive lifting of her child. Symptoms are worse at night when sleeping. She describes her pain as sore/achy, hears crepitus/popping noise as well with movement. She's not taken Ibuprofen or Tylenol for treatment. She denies injury/trauma, weakness, neck pain, numbness/tingling.   Review of Systems  Musculoskeletal: Positive for arthralgias. Negative for joint swelling and neck pain.  Neurological: Negative for numbness.       Past Medical History:  Diagnosis Date  . Elevated blood pressure (not hypertension)   . Gestational diabetes   . Hyperlipidemia      Social History   Socioeconomic History  . Marital status: Married    Spouse name: Not on file  . Number of children: Not on file  . Years of education: Not on file  . Highest education level: Not on file  Occupational History  . Not on file  Social Needs  . Financial resource strain: Not on file  . Food insecurity:    Worry: Not on file    Inability: Not on file  . Transportation needs:    Medical: Not on file    Non-medical: Not on file  Tobacco Use  . Smoking status: Never Smoker  . Smokeless tobacco: Never Used  Substance and Sexual Activity  . Alcohol use: Yes    Alcohol/week: 0.0 standard drinks    Comment: social  . Drug use: No  . Sexual activity: Not on file  Lifestyle  . Physical activity:    Days per week: Not on file    Minutes per session: Not on file  . Stress: Not on file  Relationships  . Social connections:    Talks on phone: Not on file    Gets together: Not on file    Attends religious service: Not on file    Active member of club or  organization: Not on file    Attends meetings of clubs or organizations: Not on file    Relationship status: Not on file  . Intimate partner violence:    Fear of current or ex partner: Not on file    Emotionally abused: Not on file    Physically abused: Not on file    Forced sexual activity: Not on file  Other Topics Concern  . Not on file  Social History Narrative   Married.   Works in Clinical biochemist.   Three girls.   Lives in [place]   Enjoys relaxing.    No past surgical history on file.  Family History  Problem Relation Age of Onset  . Arthritis Mother   . Hypertension Mother   . Diabetes Father   . Cancer Paternal Grandmother        Colon  . Stroke Paternal Grandmother   . Diabetes Paternal Grandmother   . Breast cancer Neg Hx     No Known Allergies  No current outpatient medications on file prior to visit.   No current facility-administered medications on file prior to visit.     BP 130/86   Pulse 84   Temp 98 F (36.7 C) (Oral)   Ht 5' 8.25" (  1.734 m)   Wt 198 lb 8 oz (90 kg)   SpO2 98%   BMI 29.96 kg/m    Objective:   Physical Exam  Constitutional: She appears well-nourished.  Neck: Neck supple.  Cardiovascular: Normal rate and regular rhythm.  Respiratory: Effort normal and breath sounds normal.  Musculoskeletal:     Left shoulder: She exhibits decreased range of motion and pain. She exhibits no tenderness and no swelling.     Comments: Slight decrease in ROM with left lateral abduction, otherwise unremarkable. 5/5 strength bilaterally. Negative empty can test.   Skin: Skin is warm and dry.           Assessment & Plan:

## 2018-12-05 NOTE — Patient Instructions (Signed)
Complete xray(s) prior to leaving today. I will notify you of your results once received.  You can take the diclofenac medication twice daily with food as needed for pain/inflammation of the joint.  Use heat/ice for discomfort. Try stretching.   Schedule an appointment with Dr. Patsy Lager for further evaluation.   It was a pleasure to see you today!

## 2018-12-05 NOTE — Assessment & Plan Note (Signed)
Present for over the last 1-2 years, no improvement. Exam today mostly benign with the exception of slight decrease in ROM. Good strength.  Start with plain films of the left shoulder, consider MRI.  Rx for diclofenac course sent to pharmacy, discussed instructions for use. Discussed heat/ice, topical OTC agents. She will also schedule an appointment with sports medicine.

## 2019-03-07 ENCOUNTER — Other Ambulatory Visit: Payer: Self-pay | Admitting: Primary Care

## 2019-03-07 DIAGNOSIS — R7303 Prediabetes: Secondary | ICD-10-CM

## 2019-03-07 DIAGNOSIS — E559 Vitamin D deficiency, unspecified: Secondary | ICD-10-CM

## 2019-03-07 DIAGNOSIS — E785 Hyperlipidemia, unspecified: Secondary | ICD-10-CM

## 2019-03-08 ENCOUNTER — Other Ambulatory Visit: Payer: BLUE CROSS/BLUE SHIELD

## 2019-03-12 ENCOUNTER — Encounter: Payer: BLUE CROSS/BLUE SHIELD | Admitting: Primary Care

## 2019-03-18 ENCOUNTER — Other Ambulatory Visit: Payer: BLUE CROSS/BLUE SHIELD

## 2019-03-20 ENCOUNTER — Encounter: Payer: BLUE CROSS/BLUE SHIELD | Admitting: Primary Care

## 2019-04-30 ENCOUNTER — Other Ambulatory Visit (INDEPENDENT_AMBULATORY_CARE_PROVIDER_SITE_OTHER): Payer: BC Managed Care – PPO

## 2019-04-30 DIAGNOSIS — R7303 Prediabetes: Secondary | ICD-10-CM

## 2019-04-30 DIAGNOSIS — E785 Hyperlipidemia, unspecified: Secondary | ICD-10-CM

## 2019-04-30 DIAGNOSIS — E559 Vitamin D deficiency, unspecified: Secondary | ICD-10-CM

## 2019-04-30 LAB — COMPREHENSIVE METABOLIC PANEL
ALT: 10 U/L (ref 0–35)
AST: 13 U/L (ref 0–37)
Albumin: 4.2 g/dL (ref 3.5–5.2)
Alkaline Phosphatase: 46 U/L (ref 39–117)
BUN: 15 mg/dL (ref 6–23)
CO2: 28 mEq/L (ref 19–32)
Calcium: 9.4 mg/dL (ref 8.4–10.5)
Chloride: 102 mEq/L (ref 96–112)
Creatinine, Ser: 0.93 mg/dL (ref 0.40–1.20)
GFR: 79.92 mL/min (ref >60.00)
Glucose, Bld: 105 mg/dL — ABNORMAL HIGH (ref 70–99)
Potassium: 4.1 mEq/L (ref 3.5–5.1)
Sodium: 138 mEq/L (ref 135–145)
Total Bilirubin: 0.4 mg/dL (ref 0.2–1.2)
Total Protein: 6.8 g/dL (ref 6.0–8.3)

## 2019-04-30 LAB — LIPID PANEL
Cholesterol: 219 mg/dL — ABNORMAL HIGH (ref 0–200)
HDL: 43.5 mg/dL (ref >39.00)
LDL Cholesterol: 154 mg/dL — ABNORMAL HIGH (ref 0–99)
NonHDL: 175.82
Total CHOL/HDL Ratio: 5
Triglycerides: 107 mg/dL (ref 0.0–149.0)
VLDL: 21.4 mg/dL (ref 0.0–40.0)

## 2019-04-30 LAB — HEMOGLOBIN A1C: Hgb A1c MFr Bld: 5.7 % (ref 4.6–6.5)

## 2019-04-30 LAB — VITAMIN D 25 HYDROXY (VIT D DEFICIENCY, FRACTURES): VITD: 18.7 ng/mL — ABNORMAL LOW (ref 30.00–100.00)

## 2019-05-07 ENCOUNTER — Other Ambulatory Visit: Payer: Self-pay

## 2019-05-07 ENCOUNTER — Encounter: Payer: Self-pay | Admitting: Primary Care

## 2019-05-07 ENCOUNTER — Ambulatory Visit (INDEPENDENT_AMBULATORY_CARE_PROVIDER_SITE_OTHER): Payer: BC Managed Care – PPO | Admitting: Primary Care

## 2019-05-07 ENCOUNTER — Other Ambulatory Visit (HOSPITAL_COMMUNITY)
Admission: RE | Admit: 2019-05-07 | Discharge: 2019-05-07 | Disposition: A | Discharge status: Home / Self Care | Payer: BC Managed Care – PPO | Source: Ambulatory Visit | Attending: Primary Care | Admitting: Primary Care

## 2019-05-07 VITALS — BP 126/82 | HR 84 | Temp 97.8°F | Ht 68.25 in | Wt 190.5 lb

## 2019-05-07 DIAGNOSIS — R7303 Prediabetes: Secondary | ICD-10-CM

## 2019-05-07 DIAGNOSIS — Z124 Encounter for screening for malignant neoplasm of cervix: Secondary | ICD-10-CM | POA: Insufficient documentation

## 2019-05-07 DIAGNOSIS — E785 Hyperlipidemia, unspecified: Secondary | ICD-10-CM

## 2019-05-07 DIAGNOSIS — R030 Elevated blood-pressure reading, without diagnosis of hypertension: Secondary | ICD-10-CM

## 2019-05-07 DIAGNOSIS — Z1239 Encounter for other screening for malignant neoplasm of breast: Secondary | ICD-10-CM

## 2019-05-07 DIAGNOSIS — G8929 Other chronic pain: Secondary | ICD-10-CM

## 2019-05-07 DIAGNOSIS — E559 Vitamin D deficiency, unspecified: Secondary | ICD-10-CM | POA: Diagnosis not present

## 2019-05-07 DIAGNOSIS — Z0000 Encounter for general adult medical examination without abnormal findings: Secondary | ICD-10-CM | POA: Diagnosis not present

## 2019-05-07 DIAGNOSIS — M25512 Pain in left shoulder: Secondary | ICD-10-CM | POA: Diagnosis not present

## 2019-05-07 MED ORDER — VITAMIN D (ERGOCALCIFEROL) 1.25 MG (50000 UNIT) PO CAPS: ORAL_CAPSULE | ORAL | 0 refills | Status: DC

## 2019-05-07 NOTE — Assessment & Plan Note (Signed)
Increased on recent labs. Strongly advised she work on a healthy diet and start exercising. Repeat lipids in 3 months.

## 2019-05-07 NOTE — Assessment & Plan Note (Signed)
Normal reading in the office today, continue to monitor. Encouraged healthy diet and exercise.

## 2019-05-07 NOTE — Assessment & Plan Note (Signed)
Chronic and continued, using topical Voltaren and doing well.

## 2019-05-07 NOTE — Assessment & Plan Note (Signed)
Returned as of recent labs. Encouraged healthy diet, regular exercise. Repeat in 3 months.

## 2019-05-07 NOTE — Assessment & Plan Note (Signed)
Lower on recent labs, not taking vitamin D. Rx for 50,000 unit capsules once weekly sent to pharmacy.  Repeat vitamin D in 3 months.

## 2019-05-07 NOTE — Progress Notes (Signed)
Subjective:    Patient ID: Holly [name], female    DOB: 06/06/1976, 43 y.o.   MRN: [id number]  HPI  Holly [name] is a 43 year old female who presents today for complete physical.  Immunizations: -Tetanus: Completed in 2015 -Influenza: Due this season   Diet: She endorses a fair diet. Eating few meals per day, take out food, fast food, little home cooked meals. She is drinking water, little soda. Exercise: She is walking 10,000 steps daily.   Eye exam: Completed in 2019 Dental exam: Completes annually Pap Smear: Completed in 2017, due Mammogram: Completed in August 2019  BP Readings from Last 3 Encounters:  05/07/19 126/82  12/05/18 130/86  08/16/18 126/82     Review of Systems  Constitutional: Negative for unexpected weight change.  HENT: Negative for rhinorrhea.   Respiratory: Negative for cough and shortness of breath.   Cardiovascular: Negative for chest pain.  Gastrointestinal: Negative for constipation and diarrhea.  Genitourinary: Negative for difficulty urinating.  Musculoskeletal: Positive for arthralgias.       Chronic left shoulder pain  Skin: Negative for rash.  Allergic/Immunologic: Negative for environmental allergies.  Neurological: Negative for dizziness, numbness and headaches.  Psychiatric/Behavioral: The patient is not nervous/anxious.        Intermittent depression since the loss of her father in June 2020, overall able to handle on her own. Has missed work days and will need intermittent FMLA through October 2020.       Past Medical History:  Diagnosis Date  . Elevated blood pressure (not hypertension)   . Gestational diabetes   . Hyperlipidemia      Social History   Socioeconomic History  . Marital status: Married    Spouse name: Not on file  . Number of children: Not on file  . Years of education: Not on file  . Highest education level: Not on file  Occupational History  . Not on file  Social Needs  . Financial resource strain: Not  on file  . Food insecurity    Worry: Not on file    Inability: Not on file  . Transportation needs    Medical: Not on file    Non-medical: Not on file  Tobacco Use  . Smoking status: Never Smoker  . Smokeless tobacco: Never Used  Substance and Sexual Activity  . Alcohol use: Yes    Alcohol/week: 0.0 standard drinks    Comment: social  . Drug use: No  . Sexual activity: Not on file  Lifestyle  . Physical activity    Days per week: Not on file    Minutes per session: Not on file  . Stress: Not on file  Relationships  . Social Musicianconnections    Talks on phone: Not on file    Gets together: Not on file    Attends religious service: Not on file    Active member of club or organization: Not on file    Attends meetings of clubs or organizations: Not on file    Relationship status: Not on file  . Intimate partner violence    Fear of current or ex partner: Not on file    Emotionally abused: Not on file    Physically abused: Not on file    Forced sexual activity: Not on file  Other Topics Concern  . Not on file  Social History Narrative   Married.   Works in Clinical biochemistcustomer service.   Three girls.   Lives in [place]   Enjoys relaxing.  No past surgical history on file.  Family History  Problem Relation Age of Onset  . Arthritis Mother   . Hypertension Mother   . Diabetes Father   . Cancer Paternal Grandmother        Colon  . Stroke Paternal Grandmother   . Diabetes Paternal Grandmother   . Breast cancer Neg Hx     No Known Allergies  No current outpatient medications on file prior to visit.   No current facility-administered medications on file prior to visit.     BP 126/82   Pulse 84   Temp 97.8 F (36.6 C) (Temporal)   Ht 5' 8.25" (1.734 m)   Wt 190 lb 8 oz (86.4 kg)   SpO2 98%   BMI 28.75 kg/m    Objective:   Physical Exam  Constitutional: She is oriented to person, place, and time. She appears well-nourished.  HENT:  Mouth/Throat: No oropharyngeal  exudate.  Eyes: Pupils are equal, round, and reactive to light. EOM are normal.  Neck: Neck supple. No thyromegaly present.  Cardiovascular: Normal rate and regular rhythm.  Respiratory: Effort normal and breath sounds normal.  GI: Soft. Bowel sounds are normal. There is no abdominal tenderness.  Genitourinary: There is no tenderness or lesion on the right labia. There is no tenderness or lesion on the left labia. Cervix exhibits discharge. Cervix exhibits no motion tenderness. Right adnexum displays no tenderness. Left adnexum displays no tenderness.    No vaginal discharge.     Genitourinary Comments: Scant amount of grey discharge without foul smell.    Musculoskeletal: Normal range of motion.  Neurological: She is alert and oriented to person, place, and time.  Skin: Skin is warm and dry.  Psychiatric: She has a normal mood and affect.           Assessment & Plan:

## 2019-05-07 NOTE — Assessment & Plan Note (Signed)
Immunizations UTD. Mammogram due, order placed. Pap smear due, ordered and pending today. Discussed the importance of a healthy diet and regular exercise in order for weight loss, and to reduce the risk of any potential medical problems. Exam unremarkable. Labs reviewed. Follow up in 1 year for CPE.

## 2019-05-07 NOTE — Patient Instructions (Signed)
Start exercising. You should be getting 150 minutes of moderate intensity exercise weekly.  It's important to improve your diet by reducing consumption of fast food, fried food, processed snack foods, sugary drinks. Increase consumption of fresh vegetables and fruits, whole grains, water.  Ensure you are drinking 64 ounces of water daily.  Schedule a lab only appointment for 3 months.  It was a pleasure to see you today!    Preventive Care 32-42 Years Old, Female Preventive care refers to visits with your health care provider and lifestyle choices that can promote health and wellness. This includes:  A yearly physical exam. This may also be called an annual well check.  Regular dental visits and eye exams.  Immunizations.  Screening for certain conditions.  Healthy lifestyle choices, such as eating a healthy diet, getting regular exercise, not using drugs or products that contain nicotine and tobacco, and limiting alcohol use. What can I expect for my preventive care visit? Physical exam Your health care provider will check your:  Height and weight. This may be used to calculate body mass index (BMI), which tells if you are at a healthy weight.  Heart rate and blood pressure.  Skin for abnormal spots. Counseling Your health care provider may ask you questions about your:  Alcohol, tobacco, and drug use.  Emotional well-being.  Home and relationship well-being.  Sexual activity.  Eating habits.  Work and work Statistician.  Method of birth control.  Menstrual cycle.  Pregnancy history. What immunizations do I need?  Influenza (flu) vaccine  This is recommended every year. Tetanus, diphtheria, and pertussis (Tdap) vaccine  You may need a Td booster every 10 years. Varicella (chickenpox) vaccine  You may need this if you have not been vaccinated. Zoster (shingles) vaccine  You may need this after age 15. Measles, mumps, and rubella (MMR) vaccine  You  may need at least one dose of MMR if you were born in 1957 or later. You may also need a second dose. Pneumococcal conjugate (PCV13) vaccine  You may need this if you have certain conditions and were not previously vaccinated. Pneumococcal polysaccharide (PPSV23) vaccine  You may need one or two doses if you smoke cigarettes or if you have certain conditions. Meningococcal conjugate (MenACWY) vaccine  You may need this if you have certain conditions. Hepatitis A vaccine  You may need this if you have certain conditions or if you travel or work in places where you may be exposed to hepatitis A. Hepatitis B vaccine  You may need this if you have certain conditions or if you travel or work in places where you may be exposed to hepatitis B. Haemophilus influenzae type b (Hib) vaccine  You may need this if you have certain conditions. Human papillomavirus (HPV) vaccine  If recommended by your health care provider, you may need three doses over 6 months. You may receive vaccines as individual doses or as more than one vaccine together in one shot (combination vaccines). Talk with your health care provider about the risks and benefits of combination vaccines. What tests do I need? Blood tests  Lipid and cholesterol levels. These may be checked every 5 years, or more frequently if you are over 16 years old.  Hepatitis C test.  Hepatitis B test. Screening  Lung cancer screening. You may have this screening every year starting at age 14 if you have a 30-pack-year history of smoking and currently smoke or have quit within the past 15 years.  Colorectal cancer  screening. All adults should have this screening starting at age 7 and continuing until age 47. Your health care provider may recommend screening at age 53 if you are at increased risk. You will have tests every 1-10 years, depending on your results and the type of screening test.  Diabetes screening. This is done by checking your  blood sugar (glucose) after you have not eaten for a while (fasting). You may have this done every 1-3 years.  Mammogram. This may be done every 1-2 years. Talk with your health care provider about when you should start having regular mammograms. This may depend on whether you have a family history of breast cancer.  BRCA-related cancer screening. This may be done if you have a family history of breast, ovarian, tubal, or peritoneal cancers.  Pelvic exam and Pap test. This may be done every 3 years starting at age 47. Starting at age 3, this may be done every 5 years if you have a Pap test in combination with an HPV test. Other tests  Sexually transmitted disease (STD) testing.  Bone density scan. This is done to screen for osteoporosis. You may have this scan if you are at high risk for osteoporosis. Follow these instructions at home: Eating and drinking  Eat a diet that includes fresh fruits and vegetables, whole grains, lean protein, and low-fat dairy.  Take vitamin and mineral supplements as recommended by your health care provider.  Do not drink alcohol if: ? Your health care provider tells you not to drink. ? You are pregnant, may be pregnant, or are planning to become pregnant.  If you drink alcohol: ? Limit how much you have to 0-1 drink a day. ? Be aware of how much alcohol is in your drink. In the [place] one drink equals one 12 oz bottle of beer (355 mL), one 5 oz glass of wine (148 mL), or one 1 oz glass of hard liquor (44 mL). Lifestyle  Take daily care of your teeth and gums.  Stay active. Exercise for at least 30 minutes on 5 or more days each week.  Do not use any products that contain nicotine or tobacco, such as cigarettes, e-cigarettes, and chewing tobacco. If you need help quitting, ask your health care provider.  If you are sexually active, practice safe sex. Use a condom or other form of birth control (contraception) in order to prevent pregnancy and STIs  (sexually transmitted infections).  If told by your health care provider, take low-dose aspirin daily starting at age 33. What's next?  Visit your health care provider once a year for a well check visit.  Ask your health care provider how often you should have your eyes and teeth checked.  Stay up to date on all vaccines. This information is not intended to replace advice given to you by your health care provider. Make sure you discuss any questions you have with your health care provider. Document Released: 10/23/2015 Document Revised: 06/07/2018 Document Reviewed: 06/07/2018 Elsevier Patient Education  2020 [employer].

## 2019-05-08 ENCOUNTER — Telehealth: Payer: Self-pay | Admitting: Primary Care

## 2019-05-08 NOTE — Telephone Encounter (Signed)
fmla paperwork in [employer] in box  °For review and signature °

## 2019-05-09 LAB — CYTOLOGY - PAP
Diagnosis: NEGATIVE
HPV: NOT DETECTED

## 2019-05-09 NOTE — Telephone Encounter (Signed)
Completed and placed on Robins desk. 

## 2019-05-09 NOTE — Telephone Encounter (Signed)
Paperwork faxed 7/30

## 2019-05-10 NOTE — Telephone Encounter (Signed)
Pt aware  °Copy for pt °Copy for scan °

## 2019-05-21 ENCOUNTER — Telehealth: Payer: Self-pay | Admitting: Primary Care

## 2019-05-21 NOTE — Telephone Encounter (Signed)
Pt dropped of leave paperwork  In [employer] in box for review and signature

## 2019-05-22 NOTE — Telephone Encounter (Signed)
Completed and placed on Robins desk. 

## 2019-05-23 NOTE — Telephone Encounter (Signed)
Paperwork faxed °

## 2019-05-29 NOTE — Telephone Encounter (Signed)
Pt aware  °Copy for pt °Copy for scan °

## 2019-06-24 ENCOUNTER — Ambulatory Visit
Admission: RE | Admit: 2019-06-24 | Discharge: 2019-06-24 | Disposition: A | Discharge status: Home / Self Care | Payer: BC Managed Care – PPO | Source: Ambulatory Visit | Attending: Primary Care | Admitting: Primary Care

## 2019-06-24 DIAGNOSIS — Z1231 Encounter for screening mammogram for malignant neoplasm of breast: Secondary | ICD-10-CM | POA: Insufficient documentation

## 2019-06-24 DIAGNOSIS — Z1239 Encounter for other screening for malignant neoplasm of breast: Secondary | ICD-10-CM

## 2019-06-26 NOTE — Telephone Encounter (Signed)
Holly [name], can you extend her FMLA for another three months? This will put her returning around January 1st, 2021.

## 2019-07-01 NOTE — Telephone Encounter (Signed)
Completed and placed on Robins desk. 

## 2019-07-01 NOTE — Telephone Encounter (Signed)
Paperwork in Holly [name]'s in box To be initialed and dated Areas marked

## 2019-07-11 NOTE — Telephone Encounter (Signed)
Paperwork faxed 9/21 Copy for pt  Copy for scan

## 2019-07-28 ENCOUNTER — Other Ambulatory Visit: Payer: Self-pay | Admitting: Primary Care

## 2019-07-28 DIAGNOSIS — E559 Vitamin D deficiency, unspecified: Secondary | ICD-10-CM

## 2019-08-07 ENCOUNTER — Other Ambulatory Visit (INDEPENDENT_AMBULATORY_CARE_PROVIDER_SITE_OTHER): Payer: BC Managed Care – PPO

## 2019-08-07 DIAGNOSIS — E559 Vitamin D deficiency, unspecified: Secondary | ICD-10-CM

## 2019-08-07 DIAGNOSIS — R7303 Prediabetes: Secondary | ICD-10-CM | POA: Diagnosis not present

## 2019-08-07 DIAGNOSIS — E785 Hyperlipidemia, unspecified: Secondary | ICD-10-CM

## 2019-08-07 LAB — LIPID PANEL
Cholesterol: 225 mg/dL — ABNORMAL HIGH (ref 0–200)
HDL: 47.7 mg/dL (ref >39.00)
LDL Cholesterol: 158 mg/dL — ABNORMAL HIGH (ref 0–99)
NonHDL: 177.32
Total CHOL/HDL Ratio: 5
Triglycerides: 97 mg/dL (ref 0.0–149.0)
VLDL: 19.4 mg/dL (ref 0.0–40.0)

## 2019-08-07 LAB — HEMOGLOBIN A1C: Hgb A1c MFr Bld: 5.8 % (ref 4.6–6.5)

## 2019-08-08 LAB — VITAMIN D 25 HYDROXY (VIT D DEFICIENCY, FRACTURES): VITD: 42.95 ng/mL (ref 30.00–100.00)

## 2019-09-27 ENCOUNTER — Other Ambulatory Visit: Payer: Self-pay

## 2019-09-27 ENCOUNTER — Ambulatory Visit: Payer: BC Managed Care – PPO | Admitting: Primary Care

## 2019-09-27 ENCOUNTER — Encounter: Payer: Self-pay | Admitting: Primary Care

## 2019-09-27 DIAGNOSIS — M25512 Pain in left shoulder: Secondary | ICD-10-CM | POA: Diagnosis not present

## 2019-09-27 DIAGNOSIS — G8929 Other chronic pain: Secondary | ICD-10-CM | POA: Diagnosis not present

## 2019-09-27 NOTE — Progress Notes (Signed)
Subjective:    Patient ID: Holly [name], female    DOB: 21-Jan-1976, 43 y.o.   MRN: [id number]  HPI  Ms. Rockhold is a 43 year old female with a history of chronic shoulder pain, hyperlipidemia, prediabetes, vitamin D deficiency who presents today with a chief complaint of shoulder pain.  She was initially evaluated for this in February 2020 and endorsed a year long history of symptoms. We checked plain films and provided her with diclofenac tablets, recommended sports medicine evaluation, and discussed other conservative treatment. Her xray was normal.  During her visit in July 2020 she endorsed doing okay with topical diclofenac gel.   Since then she continues to have intermittent pain, mostly with movement. She will have irritation with her bra strap at times. She notices popping sensations with movement at times. She really notices her discomfort when braiding her daughter's hair, sleeping on her side, putting things on shelves.  She denies neck pain, numbness/tinlging, weakness, [name] sided pain. She didn't find improvement with topical or oral treatment. She is currently not taking anything for pain.  Review of Systems  Musculoskeletal: Positive for arthralgias.  Skin: Negative for color change.  Neurological: Negative for weakness and numbness.       Past Medical History:  Diagnosis Date  . Elevated blood pressure (not hypertension)   . Gestational diabetes   . Hyperlipidemia      Social History   Socioeconomic History  . Marital status: Married    Spouse name: Not on file  . Number of children: Not on file  . Years of education: Not on file  . Highest education level: Not on file  Occupational History  . Not on file  Tobacco Use  . Smoking status: Never Smoker  . Smokeless tobacco: Never Used  Substance and Sexual Activity  . Alcohol use: Yes    Alcohol/week: 0.0 standard drinks    Comment: social  . Drug use: No  . Sexual activity: Not on file  Other Topics  Concern  . Not on file  Social History Narrative   Married.   Works in Clinical biochemist.   Three girls.   Lives in [place]   Enjoys relaxing.   Social Determinants of Health   Financial Resource Strain:   . Difficulty of Paying Living Expenses: Not on file  Food Insecurity:   . Worried About Programme researcher, broadcasting/film/video in the Last Year: Not on file  . Ran Out of Food in the Last Year: Not on file  Transportation Needs:   . Lack of Transportation (Medical): Not on file  . Lack of Transportation (Non-Medical): Not on file  Physical Activity:   . Days of Exercise per Week: Not on file  . Minutes of Exercise per Session: Not on file  Stress:   . Feeling of Stress : Not on file  Social Connections:   . Frequency of Communication with Friends and Family: Not on file  . Frequency of Social Gatherings with Friends and Family: Not on file  . Attends Religious Services: Not on file  . Active Member of Clubs or Organizations: Not on file  . Attends Banker Meetings: Not on file  . Marital Status: Not on file  Intimate Partner Violence:   . Fear of Current or Ex-Partner: Not on file  . Emotionally Abused: Not on file  . Physically Abused: Not on file  . Sexually Abused: Not on file    No past surgical history on file.  Family History  Problem Relation Age of Onset  . Arthritis Mother   . Hypertension Mother   . Diabetes Father   . Cancer Paternal Grandmother        Colon  . Stroke Paternal Grandmother   . Diabetes Paternal Grandmother   . Breast cancer Neg Hx     No Known Allergies  Current Outpatient Medications on File Prior to Visit  Medication Sig Dispense Refill  . Cholecalciferol (VITAMIN D) 50 MCG (2000 UT) CAPS Take by mouth.     No current facility-administered medications on file prior to visit.    BP 126/80   Pulse 90   Temp (!) 96.5 F (35.8 C) (Temporal)   Ht 5' 8.25" (1.734 m)   Wt 198 lb 8 oz (90 kg)   SpO2 96%   BMI 29.96 kg/m     Objective:   Physical Exam  Constitutional: She appears well-nourished.  Cardiovascular: Normal rate.  Respiratory: Effort normal.  Musculoskeletal:     Left shoulder: Pain present. No tenderness or bony tenderness. Decreased range of motion.     Comments: Decrease in ROM with lateral and posterior abduction due to pain. 5/5 strength bilaterally to upper extremities.   Skin: Skin is warm and dry.           Assessment & Plan:

## 2019-09-27 NOTE — Patient Instructions (Signed)
Start Ibuprofen 600 mg every 8 hours as needed. Use Tylenol 500 mg twice daily in between doses.   Schedule a visit with Dr. Lorelei Pont for shoulder pain.  It was a pleasure to see you today!

## 2019-09-27 NOTE — Assessment & Plan Note (Signed)
Continued. Will have her seen by sports medicine for potential injection and evaluation. She likely needs PT.  Also discussed to start alternating Ibuprofen and Tylenol during the day.  No need for MRI at this point as I do not suspect rotator cuff involvement (based on exam), but again we will have our sports medicine evaluate.   She agrees with plan.

## 2019-09-30 ENCOUNTER — Ambulatory Visit: Payer: BC Managed Care – PPO | Admitting: Family Medicine

## 2019-10-07 NOTE — Telephone Encounter (Signed)
[name], can you take a look? [name], I am out all week but continue to get my chart messages sent that have time sensitive information. Just FYI.

## 2019-10-08 NOTE — Telephone Encounter (Signed)
Holly [name], will you please notify patient that I'll be back in the office Thursday and will take a look?

## 2019-10-08 NOTE — Telephone Encounter (Signed)
Left message asking pt to call office  °

## 2019-10-08 NOTE — Telephone Encounter (Signed)
Spoke with pt she wanted to extend her FMLA paperwork to December 09 2019.on her appointment 12/18 appointment notes state discuss FMLA paperwork but on office notes her shoulder pain was discussed I didn't see anything about extending FMLA  Paperwork in [employer] in box  Please initial and date changes

## 2019-10-17 NOTE — Telephone Encounter (Signed)
Holly [name] I wanted to let you know your paperwork was faxed 10/10/2019 and there is a copy here for you

## 2019-10-21 ENCOUNTER — Ambulatory Visit: Payer: BC Managed Care – PPO | Admitting: Family Medicine

## 2019-10-21 DIAGNOSIS — Z0289 Encounter for other administrative examinations: Secondary | ICD-10-CM

## 2020-10-14 ENCOUNTER — Encounter: Payer: Self-pay | Admitting: Primary Care

## 2020-10-14 ENCOUNTER — Other Ambulatory Visit: Payer: Self-pay

## 2020-10-14 ENCOUNTER — Ambulatory Visit (INDEPENDENT_AMBULATORY_CARE_PROVIDER_SITE_OTHER): Payer: BC Managed Care – PPO | Admitting: Primary Care

## 2020-10-14 ENCOUNTER — Other Ambulatory Visit: Payer: Self-pay | Admitting: Primary Care

## 2020-10-14 VITALS — BP 124/78 | HR 94 | Temp 97.2°F | Ht 68.5 in | Wt 195.0 lb

## 2020-10-14 DIAGNOSIS — Z1231 Encounter for screening mammogram for malignant neoplasm of breast: Secondary | ICD-10-CM

## 2020-10-14 DIAGNOSIS — F4323 Adjustment disorder with mixed anxiety and depressed mood: Secondary | ICD-10-CM | POA: Diagnosis not present

## 2020-10-14 DIAGNOSIS — Z0001 Encounter for general adult medical examination with abnormal findings: Secondary | ICD-10-CM

## 2020-10-14 DIAGNOSIS — E785 Hyperlipidemia, unspecified: Secondary | ICD-10-CM

## 2020-10-14 DIAGNOSIS — L409 Psoriasis, unspecified: Secondary | ICD-10-CM | POA: Insufficient documentation

## 2020-10-14 DIAGNOSIS — R030 Elevated blood-pressure reading, without diagnosis of hypertension: Secondary | ICD-10-CM | POA: Diagnosis not present

## 2020-10-14 DIAGNOSIS — L299 Pruritus, unspecified: Secondary | ICD-10-CM | POA: Diagnosis not present

## 2020-10-14 DIAGNOSIS — Z1159 Encounter for screening for other viral diseases: Secondary | ICD-10-CM | POA: Diagnosis not present

## 2020-10-14 DIAGNOSIS — E559 Vitamin D deficiency, unspecified: Secondary | ICD-10-CM

## 2020-10-14 DIAGNOSIS — R7303 Prediabetes: Secondary | ICD-10-CM

## 2020-10-14 LAB — TSH: TSH: 1.68 u[IU]/mL (ref 0.35–4.50)

## 2020-10-14 LAB — CBC
HCT: 38 % (ref 36.0–46.0)
Hemoglobin: 12.8 g/dL (ref 12.0–15.0)
MCHC: 33.6 g/dL (ref 30.0–36.0)
MCV: 90.2 fl (ref 78.0–100.0)
Platelets: 204 10*3/uL (ref 150.0–400.0)
RBC: 4.22 Mil/uL (ref 3.87–5.11)
RDW: 12.5 % (ref 11.5–15.5)
WBC: 6.4 10*3/uL (ref 4.0–10.5)

## 2020-10-14 LAB — COMPREHENSIVE METABOLIC PANEL
ALT: 11 U/L (ref 0–35)
AST: 13 U/L (ref 0–37)
Albumin: 4.4 g/dL (ref 3.5–5.2)
Alkaline Phosphatase: 46 U/L (ref 39–117)
BUN: 13 mg/dL (ref 6–23)
CO2: 27 mEq/L (ref 19–32)
Calcium: 9.2 mg/dL (ref 8.4–10.5)
Chloride: 105 mEq/L (ref 96–112)
Creatinine, Ser: 0.85 mg/dL (ref 0.40–1.20)
GFR: 83.77 mL/min (ref >60.00)
Glucose, Bld: 96 mg/dL (ref 70–99)
Potassium: 3.9 mEq/L (ref 3.5–5.1)
Sodium: 139 mEq/L (ref 135–145)
Total Bilirubin: 0.5 mg/dL (ref 0.2–1.2)
Total Protein: 7.2 g/dL (ref 6.0–8.3)

## 2020-10-14 LAB — LIPID PANEL
Cholesterol: 248 mg/dL — ABNORMAL HIGH (ref 0–200)
HDL: 47.7 mg/dL (ref >39.00)
LDL Cholesterol: 178 mg/dL — ABNORMAL HIGH (ref 0–99)
NonHDL: 200.02
Total CHOL/HDL Ratio: 5
Triglycerides: 108 mg/dL (ref 0.0–149.0)
VLDL: 21.6 mg/dL (ref 0.0–40.0)

## 2020-10-14 LAB — VITAMIN D 25 HYDROXY (VIT D DEFICIENCY, FRACTURES): VITD: 17.56 ng/mL — ABNORMAL LOW (ref 30.00–100.00)

## 2020-10-14 LAB — HEMOGLOBIN A1C: Hgb A1c MFr Bld: 5.9 % (ref 4.6–6.5)

## 2020-10-14 NOTE — Progress Notes (Signed)
Subjective:    Patient ID: Holly [name], female    DOB: [birth year], 45 y.o.   MRN: [id number]  HPI  This visit occurred during the SARS-CoV-2 public health emergency.  Safety protocols were in place, including screening questions prior to the visit, additional usage of staff PPE, and extensive cleaning of exam room while observing appropriate contact time as indicated for disinfecting solutions.   Ms. Holly [name] is a 45 year old female who presents today for complete physical. She would also like to discuss several issues.  1) Anxiety and Depression: She would also like to discuss anxiety and depression which began in mid 2020, worse over the last several months. Symptoms include irritability, little motivation to do things, feeling down, feeling anxious. She's losing hair to the frontal scalp which has thinned quite a bit. She has an appointment with dermatology scheduled for May 2022. She has lost several members recently due to Covid-19. Also with a lot of stress at work.   2) Hair Loss/Scalp Itching: Chronic and intermittent, waxes and wanes. Over the last month she's noticed frontal scalp itching and hair thinning, flaking to her skin. She's tried using tea tree oil, peppermint oil, medicated shampoo without much improvement.  She saw a dermatologist in her 20's was told to use a fungal foot spray and medicated shampoo, this helped in the past.    PHQ9 SCORE ONLY 10/14/2020  PHQ-9 Total Score 12   GAD 7 : Generalized Anxiety Score 10/14/2020  Nervous, Anxious, on Edge 1  Control/stop worrying 1  Worry too much - different things 1  Trouble relaxing 0  Restless 0  Easily annoyed or irritable 3  Afraid - awful might happen 0  Total GAD 7 Score 6  Anxiety Difficulty Somewhat difficult      Immunizations: -Tetanus: Completed in 2015 -Influenza: Declines  -Covid-19: Completed 2 doses  Diet: She endorses an unhealthy diet Exercise: She is not exercising now  Eye exam: Completes  annually  Dental exam: Completes annually   Pap Smear: Completed in 2020, negative Mammogram: Completed last in September 2020, due  BP Readings from Last 3 Encounters:  10/14/20 124/78  09/27/19 126/80  05/07/19 126/82     Review of Systems  Constitutional: Negative for unexpected weight change.  HENT: Negative for rhinorrhea.   Eyes: Negative for visual disturbance.  Respiratory: Negative for cough and shortness of breath.   Cardiovascular: Negative for chest pain.  Gastrointestinal: Negative for constipation and diarrhea.  Genitourinary: Negative for difficulty urinating.       IUD in place since 2015  Musculoskeletal: Negative for arthralgias and myalgias.  Skin: Negative for rash.       See HPI  Allergic/Immunologic: Negative for environmental allergies.  Neurological: Positive for headaches. Negative for dizziness.  Hematological: Negative for adenopathy.  Psychiatric/Behavioral: The patient is nervous/anxious.        See HPI       Past Medical History:  Diagnosis Date  . Elevated blood pressure (not hypertension)   . Gestational diabetes   . Hyperlipidemia      Social History   Socioeconomic History  . Marital status: Married    Spouse name: Not on file  . Number of children: Not on file  . Years of education: Not on file  . Highest education level: Not on file  Occupational History  . Not on file  Tobacco Use  . Smoking status: Never Smoker  . Smokeless tobacco: Never Used  Substance and Sexual Activity  .  Alcohol use: Yes    Alcohol/week: 0.0 standard drinks    Comment: social  . Drug use: No  . Sexual activity: Not on file  Other Topics Concern  . Not on file  Social History Narrative   Married.   Works in Clinical biochemist.   Three girls.   Lives in [place]   Enjoys relaxing.   Social Determinants of Health   Financial Resource Strain: Not on file  Food Insecurity: Not on file  Transportation Needs: Not on file  Physical Activity:  Not on file  Stress: Not on file  Social Connections: Not on file  Intimate Partner Violence: Not on file    History reviewed. No pertinent surgical history.  Family History  Problem Relation Age of Onset  . Arthritis Mother   . Hypertension Mother   . Diabetes Father   . Cancer Paternal Grandmother        Colon  . Stroke Paternal Grandmother   . Diabetes Paternal Grandmother   . Breast cancer Neg Hx     No Known Allergies  No current outpatient medications on file prior to visit.   No current facility-administered medications on file prior to visit.    BP 124/78   Pulse 94   Temp (!) 97.2 F (36.2 C) (Temporal)   Ht 5' 8.5" (1.74 m)   Wt 195 lb (88.5 kg)   SpO2 99%   BMI 29.22 kg/m    Objective:   Physical Exam Constitutional:      Appearance: She is well-nourished.  HENT:     [name] Ear: Tympanic membrane and ear canal normal.     Left Ear: Tympanic membrane and ear canal normal.     Mouth/Throat:     Mouth: Oropharynx is clear and moist.  Eyes:     Extraocular Movements: EOM normal.     Pupils: Pupils are equal, round, and reactive to light.  Cardiovascular:     Rate and Rhythm: Normal rate and regular rhythm.  Pulmonary:     Effort: Pulmonary effort is normal.     Breath sounds: Normal breath sounds.  Abdominal:     General: Bowel sounds are normal.     Palpations: Abdomen is soft.     Tenderness: There is no abdominal tenderness.  Musculoskeletal:        General: Normal range of motion.     Cervical back: Neck supple.  Skin:    General: Skin is warm and dry.     Comments: Scabbing throughout scalp as noted in picture, worse to frontal scalp  Neurological:     Mental Status: She is alert and oriented to person, place, and time.     Cranial Nerves: No cranial nerve deficit.     Deep Tendon Reflexes:     Reflex Scores:      Patellar reflexes are 2+ on the [name] side and 2+ on the left side. Psychiatric:        Mood and Affect: Mood and affect  and mood normal.             Assessment & Plan:

## 2020-10-14 NOTE — Assessment & Plan Note (Signed)
With rash, see physical exam in notes.  Suspect fungal involvement, no patches of hair loss but definite thinning to frontal scalp.   Consider treatment with oral antifungal, will consult with supervising physician first. Also discussed use of OTC antifungal shampoos as she has not yet tried.

## 2020-10-14 NOTE — Assessment & Plan Note (Signed)
Repeat lipid panel pending.  Discussed the importance of a healthy diet and regular exercise in order for weight loss, and to reduce the risk of any potential medical problems.  

## 2020-10-14 NOTE — Assessment & Plan Note (Signed)
Not currently on oral supplementation. Repeat vitamin D level pending.

## 2020-10-14 NOTE — Assessment & Plan Note (Signed)
Chronic since mid 2020, worse over last several months.  Discussed treatment options, offered treatment for which she kindly declines. She will update if she changes her mind.

## 2020-10-14 NOTE — Patient Instructions (Addendum)
Stop by the lab prior to leaving today. I will notify you of your results once received.   I will be in touch once I do some research for your scalp issue. You can try antifungal shampoo like Selsun Blue.  Call the Breast Center to schedule your mammogram.   Start exercising. You should be getting 150 minutes of moderate intensity exercise weekly.  It's important to improve your diet by reducing consumption of fast food, fried food, processed snack foods, sugary drinks. Increase consumption of fresh vegetables and fruits, whole grains, water.  Ensure you are drinking 64 ounces of water daily.  It was a pleasure to see you today!      []   2D Mammogram  [x]   3D Mammogram  []   Bone Density   Call for appointment   Your appointment will at the following location  [x]   [facility]  [address] [place] [facility] [place] [facility]  [phone]  []   [facility])   [address]  [place], [facility]  [phone]  []   [facility]      [address] [place], [place]        [address]         []   [facility]  [address] [place], [place]  [place]  []   [facility]   [address]   [place], [facility] [address]  []  [facility]  [address] [place], [address] [phone]    Make sure to wear two peace clothing  No lotions powders or deodorants the day of the appointment Make sure to bring picture ID and insurance card.  Bring list of medications you are currently taking including any supplements.

## 2020-10-14 NOTE — Assessment & Plan Note (Signed)
Tetanus UTD, declines influenza vaccine. Pap smear UTD. Mammogram due, order placed.  Discussed the importance of a healthy diet and regular exercise in order for weight loss, and to reduce the risk of any potential medical problems.  Exam today as noted. Labs pending.

## 2020-10-14 NOTE — Assessment & Plan Note (Signed)
Discussed the importance of a healthy diet and regular exercise in order for weight loss, and to reduce the risk of any potential medical problems.  Repeat A1C pending. 

## 2020-10-14 NOTE — Assessment & Plan Note (Signed)
Normotensive in the office today. Continue to monitor.

## 2020-10-15 ENCOUNTER — Encounter: Payer: Self-pay | Admitting: Primary Care

## 2020-10-15 ENCOUNTER — Other Ambulatory Visit: Payer: Self-pay | Admitting: Primary Care

## 2020-10-15 DIAGNOSIS — R7303 Prediabetes: Secondary | ICD-10-CM

## 2020-10-15 DIAGNOSIS — E559 Vitamin D deficiency, unspecified: Secondary | ICD-10-CM

## 2020-10-15 DIAGNOSIS — E785 Hyperlipidemia, unspecified: Secondary | ICD-10-CM

## 2020-10-15 DIAGNOSIS — L409 Psoriasis, unspecified: Secondary | ICD-10-CM

## 2020-10-15 DIAGNOSIS — F4323 Adjustment disorder with mixed anxiety and depressed mood: Secondary | ICD-10-CM

## 2020-10-15 LAB — HEPATITIS C ANTIBODY
Hepatitis C Ab: NONREACTIVE
SIGNAL TO CUT-OFF: 0.01 (ref <1.00)

## 2020-10-15 MED ORDER — VITAMIN D (ERGOCALCIFEROL) 1.25 MG (50000 UNIT) PO CAPS: ORAL_CAPSULE | ORAL | 0 refills | Status: DC

## 2020-10-15 MED ORDER — KETOCONAZOLE 2 % EX SHAM
MEDICATED_SHAMPOO | CUTANEOUS | 0 refills | Status: DC
Start: 2020-10-15 — End: 2021-01-20

## 2020-10-15 MED ORDER — CLOBETASOL PROPIONATE 0.05 % EX SOLN: 1.0000 "application " | Freq: Two times a day (BID) | CUTANEOUS | 0 refills | Status: DC

## 2020-10-15 NOTE — Assessment & Plan Note (Signed)
Recent level of 17, will send in prescription for 50,000 IUs weekly x3 months then repeat vitamin D at that time

## 2020-10-15 NOTE — Assessment & Plan Note (Signed)
Verified with dermatology. Will treat with ketoconazole 2% shampoo 3 times weekly, and clobetasol scalp solution 0.05% twice daily.  We discussed directions via phone, she verbalized understanding.  She will update

## 2020-10-15 NOTE — Assessment & Plan Note (Addendum)
Spoke with patient via phone regarding her lab results when she mentioned a request to take a leave of absence from work given ongoing anxiety and depression symptoms.  Since her last visit she found out that another family member has been diagnosed with COVID and is hospitalized.  She denies SI/HI.  We will refer her to therapy. Agreed to [facility] absence starting October 16, 2020 through November 27, 2020 with return date of November 30, 2020.  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

## 2020-10-15 NOTE — Telephone Encounter (Signed)
This was addressed via phone this afternoon.

## 2020-10-15 NOTE — Assessment & Plan Note (Signed)
About the same, no improvement.  Discussed the absolute need to work on weight loss through diet and exercise in order to prevent diabetes.  Continue to monitor.

## 2020-10-16 NOTE — Telephone Encounter (Signed)
FMLA paperwork completed and placed in Joellen's inbox. 

## 2020-10-16 NOTE — Telephone Encounter (Signed)
FMLA paperwork dropped off and unable to fill as it was discussed with PCP. Placed with PCP to fill and sign.

## 2020-10-20 NOTE — Telephone Encounter (Signed)
Paperwork complete. Patient notified.  Copy for scan Copy for patient.

## 2020-10-22 ENCOUNTER — Other Ambulatory Visit: Payer: Self-pay

## 2020-10-22 DIAGNOSIS — L409 Psoriasis, unspecified: Secondary | ICD-10-CM

## 2020-10-23 NOTE — Telephone Encounter (Signed)
No, refill too soon.

## 2020-10-23 NOTE — Telephone Encounter (Signed)
Looks like given 8 days ago should this need refill?

## 2020-10-28 ENCOUNTER — Ambulatory Visit (INDEPENDENT_AMBULATORY_CARE_PROVIDER_SITE_OTHER): Payer: BC Managed Care – PPO | Admitting: Psychology

## 2020-10-28 DIAGNOSIS — F331 Major depressive disorder, recurrent, moderate: Secondary | ICD-10-CM | POA: Diagnosis not present

## 2020-11-04 ENCOUNTER — Ambulatory Visit
Admission: RE | Admit: 2020-11-04 | Discharge: 2020-11-04 | Disposition: A | Discharge status: Home / Self Care | Payer: BC Managed Care – PPO | Source: Ambulatory Visit | Attending: Primary Care | Admitting: Primary Care

## 2020-11-04 ENCOUNTER — Other Ambulatory Visit: Payer: Self-pay

## 2020-11-04 DIAGNOSIS — Z1231 Encounter for screening mammogram for malignant neoplasm of breast: Secondary | ICD-10-CM | POA: Insufficient documentation

## 2020-11-08 ENCOUNTER — Other Ambulatory Visit: Payer: Self-pay | Admitting: Primary Care

## 2020-11-08 DIAGNOSIS — L409 Psoriasis, unspecified: Secondary | ICD-10-CM

## 2020-11-09 NOTE — Telephone Encounter (Signed)
Did you want to refill this?

## 2020-11-09 NOTE — Telephone Encounter (Signed)
How she doing with this shampoo and solution for her scalp?

## 2020-11-10 ENCOUNTER — Other Ambulatory Visit: Payer: Self-pay

## 2020-11-10 DIAGNOSIS — L409 Psoriasis, unspecified: Secondary | ICD-10-CM

## 2020-11-11 MED ORDER — CLOBETASOL PROPIONATE 0.05 % EX SOLN: 1.0000 "application " | Freq: Two times a day (BID) | CUTANEOUS | 0 refills | Status: DC

## 2020-11-11 NOTE — Telephone Encounter (Signed)
Noted, will refill solution.

## 2020-11-11 NOTE — Telephone Encounter (Signed)
Called and spoke to patient she has plenty of the shampoo left but the refill needs to be on the solution. She has been able to tell improvement with symptoms.

## 2020-11-12 ENCOUNTER — Ambulatory Visit (INDEPENDENT_AMBULATORY_CARE_PROVIDER_SITE_OTHER): Payer: BC Managed Care – PPO | Admitting: Psychology

## 2020-11-12 DIAGNOSIS — F330 Major depressive disorder, recurrent, mild: Secondary | ICD-10-CM

## 2020-11-16 NOTE — Telephone Encounter (Signed)
Holly [name], can you print and adjust dates? I can re-sign if needed.

## 2020-11-18 ENCOUNTER — Ambulatory Visit: Payer: BC Managed Care – PPO | Admitting: Psychology

## 2020-11-25 ENCOUNTER — Ambulatory Visit: Payer: BC Managed Care – PPO | Admitting: Psychology

## 2020-12-02 ENCOUNTER — Ambulatory Visit: Payer: BC Managed Care – PPO | Admitting: Psychology

## 2020-12-09 ENCOUNTER — Ambulatory Visit: Payer: BC Managed Care – PPO | Admitting: Psychology

## 2020-12-31 ENCOUNTER — Other Ambulatory Visit: Payer: Self-pay | Admitting: Primary Care

## 2020-12-31 DIAGNOSIS — E559 Vitamin D deficiency, unspecified: Secondary | ICD-10-CM

## 2021-01-01 NOTE — Telephone Encounter (Signed)
Patient needs repeat lipids and vitamin D, please set up lab appt.

## 2021-01-01 NOTE — Telephone Encounter (Signed)
Called to schedule fasting labs. LVM to call back.

## 2021-01-04 ENCOUNTER — Other Ambulatory Visit: Payer: Self-pay

## 2021-01-04 DIAGNOSIS — L409 Psoriasis, unspecified: Secondary | ICD-10-CM

## 2021-01-04 NOTE — Telephone Encounter (Signed)
Called patient and scheduled for 3/30.

## 2021-01-05 NOTE — Telephone Encounter (Signed)
Ok to fill 

## 2021-01-06 ENCOUNTER — Other Ambulatory Visit: Payer: Self-pay

## 2021-01-06 ENCOUNTER — Other Ambulatory Visit (INDEPENDENT_AMBULATORY_CARE_PROVIDER_SITE_OTHER): Payer: BC Managed Care – PPO

## 2021-01-06 DIAGNOSIS — E785 Hyperlipidemia, unspecified: Secondary | ICD-10-CM | POA: Diagnosis not present

## 2021-01-06 DIAGNOSIS — E559 Vitamin D deficiency, unspecified: Secondary | ICD-10-CM | POA: Diagnosis not present

## 2021-01-06 LAB — LIPID PANEL
Cholesterol: 234 mg/dL — ABNORMAL HIGH (ref 0–200)
HDL: 51.7 mg/dL (ref >39.00)
LDL Cholesterol: 163 mg/dL — ABNORMAL HIGH (ref 0–99)
NonHDL: 182.62
Total CHOL/HDL Ratio: 5
Triglycerides: 99 mg/dL (ref 0.0–149.0)
VLDL: 19.8 mg/dL (ref 0.0–40.0)

## 2021-01-06 LAB — VITAMIN D 25 HYDROXY (VIT D DEFICIENCY, FRACTURES): VITD: 44.65 ng/mL (ref 30.00–100.00)

## 2021-01-06 NOTE — Telephone Encounter (Signed)
Left message to return call to our office.  

## 2021-01-06 NOTE — Telephone Encounter (Signed)
Will you find out how she's doing on this treatment? If improving then okay to send as pended.

## 2021-01-07 ENCOUNTER — Other Ambulatory Visit: Payer: Self-pay

## 2021-01-07 MED ORDER — SIMVASTATIN 10 MG PO TABS: 10.0000 mg | ORAL_TABLET | Freq: Every day | ORAL | 2 refills | Status: DC

## 2021-01-07 NOTE — Telephone Encounter (Signed)
Left message to return call to our office. Have sent my chart message as well.

## 2021-01-12 NOTE — Telephone Encounter (Signed)
Have called patient x 3 no call. My chart has been sent to patient as well. Ok to refuse refill and send letter?

## 2021-01-17 NOTE — Telephone Encounter (Signed)
It looks like she read her My Chart message, can we just ask her if she's still using these medications for her scalp psoriasis?

## 2021-01-19 DIAGNOSIS — L409 Psoriasis, unspecified: Secondary | ICD-10-CM

## 2021-01-19 NOTE — Telephone Encounter (Signed)
Mychart sent to patient.

## 2021-01-20 MED ORDER — KETOCONAZOLE 2 % EX SHAM: MEDICATED_SHAMPOO | CUTANEOUS | 0 refills | Status: DC

## 2021-01-20 MED ORDER — CLOBETASOL PROPIONATE 0.05 % EX SOLN: 1.0000 "application " | Freq: Two times a day (BID) | CUTANEOUS | 0 refills | Status: AC

## 2021-03-03 ENCOUNTER — Ambulatory Visit: Payer: Self-pay | Admitting: Dermatology

## 2021-03-24 ENCOUNTER — Other Ambulatory Visit: Payer: Self-pay | Admitting: Primary Care

## 2021-03-24 DIAGNOSIS — E785 Hyperlipidemia, unspecified: Secondary | ICD-10-CM

## 2021-03-31 ENCOUNTER — Other Ambulatory Visit: Payer: Self-pay | Admitting: Primary Care

## 2021-03-31 DIAGNOSIS — E559 Vitamin D deficiency, unspecified: Secondary | ICD-10-CM

## 2021-04-01 NOTE — Telephone Encounter (Signed)
Result note from March 2022 endorses that she was not taking calcium or vitamin d, however, we received a refill request for 50,000 units weekly of vitamin D.   Has she been taking this? I originally started this Rx in January 2022.

## 2021-04-06 NOTE — Telephone Encounter (Signed)
Left message on voicemail to call office.  

## 2021-04-08 ENCOUNTER — Other Ambulatory Visit: Payer: BC Managed Care – PPO

## 2021-04-09 NOTE — Telephone Encounter (Signed)
Left message to return call to our office.  

## 2021-04-11 ENCOUNTER — Other Ambulatory Visit: Payer: Self-pay | Admitting: Primary Care

## 2021-04-11 NOTE — Telephone Encounter (Signed)
Received refill request for simvastatin 10 mg (cholesterol medication) that [facility] sent in during my absence. She was supposed to come back for repeat lipid panel check in June, needs to have this done.  Has she been taking simvastatin 10 mg daily since prescribed? Needs lab appt. Four hour fast. Will send in refills once we have her lab results

## 2021-04-13 NOTE — Telephone Encounter (Signed)
Called patient she did not need to have filled. She finished that script. She is taking over the counter Vit D but not sure what the dose is.

## 2021-04-14 NOTE — Telephone Encounter (Signed)
Left message to return call to our office.  

## 2021-04-16 NOTE — Telephone Encounter (Signed)
Left message to return call to our office.  

## 2021-04-20 NOTE — Telephone Encounter (Signed)
Left message to return call to our office. Have called patient x 3 l/m to call office no call back. Will send my chart message to call as well.

## 2021-12-17 ENCOUNTER — Other Ambulatory Visit: Payer: Self-pay | Admitting: Primary Care

## 2021-12-17 DIAGNOSIS — Z1231 Encounter for screening mammogram for malignant neoplasm of breast: Secondary | ICD-10-CM

## 2022-02-01 ENCOUNTER — Ambulatory Visit
Admission: RE | Admit: 2022-02-01 | Discharge: 2022-02-01 | Disposition: A | Discharge status: Home / Self Care | Payer: Managed Care, Other (non HMO) | Source: Ambulatory Visit | Attending: Primary Care | Admitting: Primary Care

## 2022-02-01 DIAGNOSIS — Z1231 Encounter for screening mammogram for malignant neoplasm of breast: Secondary | ICD-10-CM | POA: Insufficient documentation

## 2022-02-09 ENCOUNTER — Other Ambulatory Visit: Payer: Self-pay

## 2022-02-09 ENCOUNTER — Encounter: Payer: Self-pay | Admitting: Primary Care

## 2022-02-09 ENCOUNTER — Ambulatory Visit (INDEPENDENT_AMBULATORY_CARE_PROVIDER_SITE_OTHER): Payer: Managed Care, Other (non HMO) | Admitting: Primary Care

## 2022-02-09 VITALS — BP 136/82 | HR 105 | Ht 67.5 in | Wt 195.0 lb

## 2022-02-09 DIAGNOSIS — Z0001 Encounter for general adult medical examination with abnormal findings: Secondary | ICD-10-CM | POA: Diagnosis not present

## 2022-02-09 DIAGNOSIS — R7303 Prediabetes: Secondary | ICD-10-CM

## 2022-02-09 DIAGNOSIS — Z1211 Encounter for screening for malignant neoplasm of colon: Secondary | ICD-10-CM

## 2022-02-09 DIAGNOSIS — L409 Psoriasis, unspecified: Secondary | ICD-10-CM

## 2022-02-09 DIAGNOSIS — E785 Hyperlipidemia, unspecified: Secondary | ICD-10-CM

## 2022-02-09 DIAGNOSIS — E559 Vitamin D deficiency, unspecified: Secondary | ICD-10-CM | POA: Diagnosis not present

## 2022-02-09 DIAGNOSIS — F4323 Adjustment disorder with mixed anxiety and depressed mood: Secondary | ICD-10-CM

## 2022-02-09 DIAGNOSIS — R0683 Snoring: Secondary | ICD-10-CM | POA: Insufficient documentation

## 2022-02-09 LAB — LIPID PANEL
Cholesterol: 291 mg/dL — ABNORMAL HIGH (ref 0–200)
HDL: 47.4 mg/dL (ref >39.00)
LDL Cholesterol: 220 mg/dL — ABNORMAL HIGH (ref 0–99)
NonHDL: 243.62
Total CHOL/HDL Ratio: 6
Triglycerides: 119 mg/dL (ref 0.0–149.0)
VLDL: 23.8 mg/dL (ref 0.0–40.0)

## 2022-02-09 LAB — COMPREHENSIVE METABOLIC PANEL
ALT: 14 U/L (ref 0–35)
AST: 17 U/L (ref 0–37)
Albumin: 4.6 g/dL (ref 3.5–5.2)
Alkaline Phosphatase: 57 U/L (ref 39–117)
BUN: 13 mg/dL (ref 6–23)
CO2: 29 mEq/L (ref 19–32)
Calcium: 9.9 mg/dL (ref 8.4–10.5)
Chloride: 100 mEq/L (ref 96–112)
Creatinine, Ser: 1.05 mg/dL (ref 0.40–1.20)
GFR: 64.41 mL/min (ref >60.00)
Glucose, Bld: 85 mg/dL (ref 70–99)
Potassium: 4.9 mEq/L (ref 3.5–5.1)
Sodium: 136 mEq/L (ref 135–145)
Total Bilirubin: 0.5 mg/dL (ref 0.2–1.2)
Total Protein: 7.9 g/dL (ref 6.0–8.3)

## 2022-02-09 LAB — VITAMIN D 25 HYDROXY (VIT D DEFICIENCY, FRACTURES): VITD: 18.01 ng/mL — ABNORMAL LOW (ref 30.00–100.00)

## 2022-02-09 LAB — HEMOGLOBIN A1C: Hgb A1c MFr Bld: 5.9 % (ref 4.6–6.5)

## 2022-02-09 LAB — CBC
HCT: 41.4 % (ref 36.0–46.0)
Hemoglobin: 13.8 g/dL (ref 12.0–15.0)
MCHC: 33.4 g/dL (ref 30.0–36.0)
MCV: 91 fl (ref 78.0–100.0)
Platelets: 252 10*3/uL (ref 150.0–400.0)
RBC: 4.54 Mil/uL (ref 3.87–5.11)
RDW: 13 % (ref 11.5–15.5)
WBC: 7.2 10*3/uL (ref 4.0–10.5)

## 2022-02-09 MED ORDER — NA SULFATE-K SULFATE-MG SULF 17.5-3.13-1.6 GM/177ML PO SOLN: 1.0000 | Freq: Once | ORAL | 0 refills | Status: AC

## 2022-02-09 NOTE — Progress Notes (Signed)
Gastroenterology Pre-Procedure Review ? ?Request Date: 02/28/2022 ?Requesting Physician: Dr. Tobi Bastos ? ? ?PATIENT REVIEW QUESTIONS: The patient responded to the following health history questions as indicated:   ? ?1. Are you having any GI issues? no ?2. Do you have a personal history of Polyps? no ?3. Do you have a family history of Colon Cancer or Polyps? no ?4. Diabetes Mellitus? no ?5. Joint replacements in the past 12 months?no ?6. Major health problems in the past 3 months?no ?7. Any artificial heart valves, MVP, or defibrillator?no ?   ?MEDICATIONS & ALLERGIES:    ?Patient reports the following regarding taking any anticoagulation/antiplatelet therapy:   ?Plavix, Coumadin, Eliquis, Xarelto, Lovenox, Pradaxa, Brilinta, or Effient? no ?Aspirin? no ? ?Patient confirms/reports the following medications:  ?Current Outpatient Medications  ?Medication Sig Dispense Refill  ? clobetasol (TEMOVATE) 0.05 % external solution Apply 1 application topically 2 (two) times daily. 50 mL 0  ? levonorgestrel (MIRENA) 20 MCG/DAY IUD by Intrauterine route.    ? simvastatin (ZOCOR) 10 MG tablet Take 1 tablet (10 mg total) by mouth at bedtime. For cholesterol 30 tablet 2  ? ?No current facility-administered medications for this visit.  ? ? ?Patient confirms/reports the following allergies:  ?No Known Allergies ? ?No orders of the defined types were placed in this encounter. ? ? ?AUTHORIZATION INFORMATION ?Primary Insurance: ?1D#: ?Group #: ? ?Secondary Insurance: ?1D#: ?Group #: ? ?SCHEDULE INFORMATION: ?Date: 02/28/2022 ?Time: ?Location: ?[facility] ?

## 2022-02-09 NOTE — Assessment & Plan Note (Signed)
Immunizations up-to-date. ? ?Pap smear up-to-date, due next year. ?Mammogram up-to-date. ?Colonoscopy due in a few days, referral placed to GI. ? ?Discussed the importance of a healthy diet and regular exercise in order for weight loss, and to reduce the risk of further co-morbidity. ? ?Exam today stable ?Labs pending ?

## 2022-02-09 NOTE — Assessment & Plan Note (Signed)
Suspicious for sleep apnea. ? ?Referral placed to pulmonology for further evaluation. ?

## 2022-02-09 NOTE — Assessment & Plan Note (Signed)
Improving. ? ?Continue clobetasol external solution 0.05% PRN. ?Continue to monitor.  ?

## 2022-02-09 NOTE — Assessment & Plan Note (Signed)
Repeat A1C pending.  Discussed the importance of a healthy diet and regular exercise in order for weight loss, and to reduce the risk of further co-morbidity.  

## 2022-02-09 NOTE — Assessment & Plan Note (Signed)
Improved per patient. ? ?Continue to monitor.  ?

## 2022-02-09 NOTE — Patient Instructions (Signed)
Stop by the lab prior to leaving today. I will notify you of your results once received.  ? ?You will be contacted regarding your referral to pulmonology for the sleep study and to GI for the colonoscopy.  Please let us know if you have not been contacted within two weeks.  ? ?It was a pleasure to see you today! ? ?Preventive Care 80-45 Years Old, Female ?Preventive care refers to lifestyle choices and visits with your health care provider that can promote health and wellness. Preventive care visits are also called wellness exams. ?What can I expect for my preventive care visit? ?Counseling ?Your health care provider may ask you questions about your: ?Medical history, including: ?Past medical problems. ?Family medical history. ?Pregnancy history. ?Current health, including: ?Menstrual cycle. ?Method of birth control. ?Emotional well-being. ?Home life and relationship well-being. ?Sexual activity and sexual health. ?Lifestyle, including: ?Alcohol, nicotine or tobacco, and drug use. ?Access to firearms. ?Diet, exercise, and sleep habits. ?Work and work Statistician. ?Sunscreen use. ?Safety issues such as seatbelt and bike helmet use. ?Physical exam ?Your health care provider will check your: ?Height and weight. These may be used to calculate your BMI (body mass index). BMI is a measurement that tells if you are at a healthy weight. ?Waist circumference. This measures the distance around your waistline. This measurement also tells if you are at a healthy weight and may help predict your risk of certain diseases, such as type 2 diabetes and high blood pressure. ?Heart rate and blood pressure. ?Body temperature. ?Skin for abnormal spots. ?What immunizations do I need? ? ?Vaccines are usually given at various ages, according to a schedule. Your health care provider will recommend vaccines for you based on your age, medical history, and lifestyle or other factors, such as travel or where you work. ?What tests do I  need? ?Screening ?Your health care provider may recommend screening tests for certain conditions. This may include: ?Lipid and cholesterol levels. ?Diabetes screening. This is done by checking your blood sugar (glucose) after you have not eaten for a while (fasting). ?Pelvic exam and Pap test. ?Hepatitis B test. ?Hepatitis C test. ?HIV (human immunodeficiency virus) test. ?STI (sexually transmitted infection) testing, if you are at risk. ?Lung cancer screening. ?Colorectal cancer screening. ?Mammogram. Talk with your health care provider about when you should start having regular mammograms. This may depend on whether you have a family history of breast cancer. ?BRCA-related cancer screening. This may be done if you have a family history of breast, ovarian, tubal, or peritoneal cancers. ?Bone density scan. This is done to screen for osteoporosis. ?Talk with your health care provider about your test results, treatment options, and if necessary, the need for more tests. ?Follow these instructions at home: ?Eating and drinking ? ?Eat a diet that includes fresh fruits and vegetables, whole grains, lean protein, and low-fat dairy products. ?Take vitamin and mineral supplements as recommended by your health care provider. ?Do not drink alcohol if: ?Your health care provider tells you not to drink. ?You are pregnant, may be pregnant, or are planning to become pregnant. ?If you drink alcohol: ?Limit how much you have to 0-1 drink a day. ?Know how much alcohol is in your drink. In the [place] one drink equals one 12 oz bottle of beer (355 mL), one 5 oz glass of wine (148 mL), or one 1? oz glass of hard liquor (44 mL). ?Lifestyle ?Brush your teeth every morning and night with fluoride toothpaste. Floss one time each day. ?Exercise for  at least 30 minutes 5 or more days each week. ?Do not use any products that contain nicotine or tobacco. These products include cigarettes, chewing tobacco, and vaping devices, such as  e-cigarettes. If you need help quitting, ask your health care provider. ?Do not use drugs. ?If you are sexually active, practice safe sex. Use a condom or other form of protection to prevent STIs. ?If you do not wish to become pregnant, use a form of birth control. If you plan to become pregnant, see your health care provider for a prepregnancy visit. ?Take aspirin only as told by your health care provider. Make sure that you understand how much to take and what form to take. Work with your health care provider to find out whether it is safe and beneficial for you to take aspirin daily. ?Find healthy ways to manage stress, such as: ?Meditation, yoga, or listening to music. ?Journaling. ?Talking to a trusted person. ?Spending time with friends and family. ?Minimize exposure to UV radiation to reduce your risk of skin cancer. ?Safety ?Always wear your seat belt while driving or riding in a vehicle. ?Do not drive: ?If you have been drinking alcohol. Do not ride with someone who has been drinking. ?When you are tired or distracted. ?While texting. ?If you have been using any mind-altering substances or drugs. ?Wear a helmet and other protective equipment during sports activities. ?If you have firearms in your house, make sure you follow all gun safety procedures. ?Seek help if you have been physically or sexually abused. ?What's next? ?Visit your health care provider once a year for an annual wellness visit. ?Ask your health care provider how often you should have your eyes and teeth checked. ?Stay up to date on all vaccines. ?This information is not intended to replace advice given to you by your health care provider. Make sure you discuss any questions you have with your health care provider. ?Document Revised: 03/24/2021 Document Reviewed: 03/24/2021 ?Elsevier Patient Education ? [employer]. ? ?

## 2022-02-09 NOTE — Progress Notes (Signed)
? ?Subjective:  ? ? Patient ID: Holly [name], female    DOB: 1976/02/27, 46 y.o.   MRN: [id number] ? ?HPI ? ?Holly [name] is a very pleasant 46 y.o. female who presents today for complete physical and follow up of chronic conditions. ? ?She would also like to mention snoring. Her husband has noticed snoring and periods of apnea during sleep. She will wake herself up from sleep. Chronic for the last several years, worse over the last several months. She's noticed daytime tiredness. She has a family history of loud snoring in her mother who has never undergone a sleep study. ? ?Immunizations: ?-Tetanus: 2015 ?-Influenza: Did not complete last year. ?-Covid-19: 3 vaccines ? ?Diet: Fair diet.  ?Exercise: No regular exercise. ? ?Eye exam: Completes annually  ?Dental exam: Completes semi-annually  ? ?Pap Smear: Completed in July 2020 ?Mammogram: Completed in April 2023 ?Colonoscopy: Never completed.  ? ?BP Readings from Last 3 Encounters:  ?02/09/22 136/82  ?10/14/20 124/78  ?09/27/19 126/80  ? ? ? ? ? ?Review of Systems  ?Constitutional:  Negative for unexpected weight change.  ?HENT:  Negative for rhinorrhea.   ?Respiratory:  Negative for cough and shortness of breath.   ?     Snoring, see HPI  ?Cardiovascular:  Negative for chest pain.  ?Gastrointestinal:  Negative for constipation and diarrhea.  ?Genitourinary:  Negative for difficulty urinating.  ?Musculoskeletal:  Negative for arthralgias and myalgias.  ?Skin:  Negative for rash.  ?Allergic/Immunologic: Negative for environmental allergies.  ?Neurological:  Negative for dizziness and headaches.  ?Psychiatric/Behavioral:  The patient is not nervous/anxious.   ? ?   ? ? ?Past Medical History:  ?Diagnosis Date  ? Elevated blood pressure (not hypertension)   ? Gestational diabetes   ? Hyperlipidemia   ? ? ?Social History  ? ?Socioeconomic History  ? Marital status: Married  ?  Spouse name: Not on file  ? Number of children: Not on file  ? Years of education: Not on  file  ? Highest education level: Not on file  ?Occupational History  ? Not on file  ?Tobacco Use  ? Smoking status: Never  ? Smokeless tobacco: Never  ?Vaping Use  ? Vaping Use: Never used  ?Substance and Sexual Activity  ? Alcohol use: Yes  ?  Alcohol/week: 0.0 standard drinks  ?  Comment: social  ? Drug use: No  ? Sexual activity: Not on file  ?Other Topics Concern  ? Not on file  ?Social History Narrative  ? Married.  ? Works in Therapist, art.  ? Three girls.  ? Lives in [place]  ? Enjoys relaxing.  ? ?Social Determinants of Health  ? ?Financial Resource Strain: Not on file  ?Food Insecurity: Not on file  ?Transportation Needs: Not on file  ?Physical Activity: Not on file  ?Stress: Not on file  ?Social Connections: Not on file  ?Intimate Partner Violence: Not on file  ? ? ?History reviewed. No pertinent surgical history. ? ?Family History  ?Problem Relation Age of Onset  ? Arthritis Mother   ? Hypertension Mother   ? Diabetes Father   ? Heart failure Father   ? Stroke Paternal Grandmother   ? Diabetes Paternal Grandmother   ? Colon cancer Paternal Grandmother   ? Breast cancer Neg Hx   ? ? ?No Known Allergies ? ?Current Outpatient Medications on File Prior to Visit  ?Medication Sig Dispense Refill  ? clobetasol (TEMOVATE) 0.05 % external solution Apply 1 application topically 2 (two) times daily.  50 mL 0  ? levonorgestrel (MIRENA) 20 MCG/DAY IUD by Intrauterine route.    ? simvastatin (ZOCOR) 10 MG tablet Take 1 tablet (10 mg total) by mouth at bedtime. For cholesterol 30 tablet 2  ? ketoconazole (NIZORAL) 2 % shampoo Apply 3 times weekly when showering, lather on scalp and leave for 5 to 10 minutes, then rinse thoroughly and condition as usual. (Patient not taking: Reported on 02/09/2022) 120 mL 0  ? Vitamin D, Ergocalciferol, (DRISDOL) 1.25 MG (50000 UNIT) CAPS capsule TAKE 1 CAPSULE BY MOUTH ONCE WEEKLY FOR 12 WEEKS. (Patient not taking: Reported on 02/09/2022) 12 capsule 0  ? ?No current facility-administered  medications on file prior to visit.  ? ? ?BP 136/82   Pulse (!) 105   Ht 5' 7.5" (1.715 m)   Wt 195 lb (88.5 kg)   SpO2 98%   BMI 30.09 kg/m?  ?Objective:  ? Physical Exam ?HENT:  ?   Right Ear: Tympanic membrane and ear canal normal.  ?   Left Ear: Tympanic membrane and ear canal normal.  ?   Nose: Nose normal.  ?Eyes:  ?   Conjunctiva/sclera: Conjunctivae normal.  ?   Pupils: Pupils are equal, round, and reactive to light.  ?Neck:  ?   Thyroid: No thyromegaly.  ?Cardiovascular:  ?   Rate and Rhythm: Normal rate and regular rhythm.  ?   Heart sounds: No murmur heard. ?Pulmonary:  ?   Effort: Pulmonary effort is normal.  ?   Breath sounds: Normal breath sounds. No rales.  ?Abdominal:  ?   General: Bowel sounds are normal.  ?   Palpations: Abdomen is soft.  ?   Tenderness: There is no abdominal tenderness.  ?Musculoskeletal:     ?   General: Normal range of motion.  ?   Cervical back: Neck supple.  ?Lymphadenopathy:  ?   Cervical: No cervical adenopathy.  ?Skin: ?   General: Skin is warm and dry.  ?   Findings: No rash.  ?Neurological:  ?   Mental Status: She is alert and oriented to person, place, and time.  ?   Cranial Nerves: No cranial nerve deficit.  ?   Deep Tendon Reflexes: Reflexes are normal and symmetric.  ?Psychiatric:     ?   Mood and Affect: Mood normal.  ? ? ? ? ? ?   ?Assessment & Plan:  ? ? ? ? ?This visit occurred during the SARS-CoV-2 public health emergency.  Safety protocols were in place, including screening questions prior to the visit, additional usage of staff PPE, and extensive cleaning of exam room while observing appropriate contact time as indicated for disinfecting solutions.  ?

## 2022-02-09 NOTE — Assessment & Plan Note (Addendum)
No longer on simvastatin 10 mg. ?Repeat lipid panel pending.  ?

## 2022-02-09 NOTE — Assessment & Plan Note (Signed)
Not taking vitamin D. Repeat vitamin D pending. 

## 2022-02-11 ENCOUNTER — Encounter: Payer: Self-pay | Admitting: Primary Care

## 2022-02-11 ENCOUNTER — Ambulatory Visit (INDEPENDENT_AMBULATORY_CARE_PROVIDER_SITE_OTHER): Payer: Managed Care, Other (non HMO) | Admitting: Primary Care

## 2022-02-11 ENCOUNTER — Other Ambulatory Visit: Payer: Self-pay | Admitting: Primary Care

## 2022-02-11 VITALS — BP 136/88 | HR 95 | Temp 98.0°F | Ht 67.5 in | Wt 197.8 lb

## 2022-02-11 DIAGNOSIS — R0683 Snoring: Secondary | ICD-10-CM | POA: Diagnosis not present

## 2022-02-11 DIAGNOSIS — E785 Hyperlipidemia, unspecified: Secondary | ICD-10-CM

## 2022-02-11 DIAGNOSIS — E559 Vitamin D deficiency, unspecified: Secondary | ICD-10-CM

## 2022-02-11 DIAGNOSIS — Z975 Presence of (intrauterine) contraceptive device: Secondary | ICD-10-CM

## 2022-02-11 MED ORDER — ATORVASTATIN CALCIUM 20 MG PO TABS: 20.0000 mg | ORAL_TABLET | Freq: Every day | ORAL | 0 refills | Status: AC

## 2022-02-11 MED ORDER — VITAMIN D (ERGOCALCIFEROL) 1.25 MG (50000 UNIT) PO CAPS: ORAL_CAPSULE | ORAL | 0 refills | Status: AC

## 2022-02-11 NOTE — Progress Notes (Signed)
? ?@Patient  ID: , female    DOB: 04-Feb-1976, 46 y.o.   MRN: [id number] ? ?Chief Complaint  ?Patient presents with  ? sleep consult  ?  No prior sleep study. C/o loud snoring, restless sleep and daytime sleepiness.   ? ? ?Referring provider: ?[id number], NP ? ?HPI: ?46 year old female, never smoked.  Past medical history significant for elevated blood pressure reading, prediabetes, hyperlipidemia, vitamin D deficiency, snoring.  ? ?02/11/2022 ?Patient presents today for sleep consult.  Patient has symptoms of loud snoring, witnessed apnea, restless sleep and daytime sleepiness. Husband has noticed she stops breathing while sleeping. Daytime sleepiness is not impacting her daily life. 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 Typical bedtime is between 10 PM and 11 PM.  On average it takes her 30 minutes to fall asleep.  She wakes up 1-2 times a night.  She starts her day at 6 AM.  Weight has changed 5 to 10 pounds over the last 2 years.  No prior sleep study.  She does not currently use CPAP or oxygen. Denies narcolepsy, cataplexy or sleep walking. Epworth score 4.  ? ?No Known Allergies ? ?Immunization History  ?Administered Date(s) Administered  ? PFIZER(Purple Top)SARS-COV-2 Vaccination 04/07/2020, 04/29/2020  ? 05/01/2020 21yrs & up 10/01/2021  ? Tdap 07/29/2014  ? ? ?Past Medical History:  ?Diagnosis Date  ? Elevated blood pressure (not hypertension)   ? Gestational diabetes   ? Hyperlipidemia   ? ? ?Tobacco History: ?Social History  ? ?Tobacco Use  ?Smoking Status Never  ?Smokeless Tobacco Never  ? ?Counseling given: Not Answered ? ? ?Outpatient Medications Prior to Visit  ?Medication Sig Dispense Refill  ? clobetasol (TEMOVATE) 0.05 % external solution Apply 1 application topically 2 (two) times daily. 50 mL 0  ? levonorgestrel (MIRENA) 20 MCG/DAY IUD by Intrauterine route.    ? simvastatin (ZOCOR) 10 MG tablet Take 1 tablet (10 mg total) by mouth at  bedtime. For cholesterol 30 tablet 2  ? sodium chloride (OCEAN) 0.65 % nasal spray Place into the nose.    ? ?No facility-administered medications prior to visit.  ? ?Review of Systems ? ?Review of Systems  ?Constitutional:  Positive for fatigue.  ?HENT: Negative.    ?Respiratory:  Positive for apnea.   ?Cardiovascular: Negative.   ?Psychiatric/Behavioral:  Positive for sleep disturbance.   ? ? ?Physical Exam ? ?BP 136/88 (BP Location: Left Arm, Cuff Size: Large)   Pulse 95   Temp 98 ?F (36.7 ?C) (Temporal)   Ht 5' 7.5" (1.715 m)   Wt 197 lb 12.8 oz (89.7 kg)   SpO2 98%   BMI 30.52 kg/m?  ?Physical Exam ?Constitutional:   ?   Appearance: Normal appearance.  ?HENT:  ?   Head: Normocephalic and atraumatic.  ?   Mouth/Throat:  ?   Mouth: Mucous membranes are moist.  ?   Pharynx: Oropharynx is clear.  ?Cardiovascular:  ?   Rate and Rhythm: Normal rate and regular rhythm.  ?Pulmonary:  ?   Effort: Pulmonary effort is normal.  ?   Breath sounds: Normal breath sounds. No wheezing, rhonchi or rales.  ?Musculoskeletal:     ?   General: Normal range of motion.  ?   Cervical back: Normal range of motion and neck supple.  ?Skin: ?   General: Skin is warm and dry.  ?Neurological:  ?   General: No focal deficit present.  ?   Mental Status: She is  alert and oriented to person, place, and time. Mental status is at baseline.  ?Psychiatric:     ?   Mood and Affect: Mood normal.     ?   Behavior: Behavior normal.     ?   Thought Content: Thought content normal.     ?   Judgment: Judgment normal.  ?  ? ?Lab Results: ? ?CBC ?   ?Component Value Date/Time  ? WBC 7.2 02/09/2022 1240  ? RBC 4.54 02/09/2022 1240  ? HGB 13.8 02/09/2022 1240  ? HCT 41.4 02/09/2022 1240  ? PLT 252.0 02/09/2022 1240  ? MCV 91.0 02/09/2022 1240  ? MCHC 33.4 02/09/2022 1240  ? RDW 13.0 02/09/2022 1240  ? ? ?BMET ?   ?Component Value Date/Time  ? NA 136 02/09/2022 1240  ? K 4.9 02/09/2022 1240  ? CL 100 02/09/2022 1240  ? CO2 29 02/09/2022 1240  ? GLUCOSE 85  02/09/2022 1240  ? BUN 13 02/09/2022 1240  ? CREATININE 1.05 02/09/2022 1240  ? CALCIUM 9.9 02/09/2022 1240  ? ? ?BNP ?No results found for: BNP ? ?ProBNP ?No results found for: PROBNP ? ?Imaging: ?MM 3D SCREEN BREAST BILATERAL ? ?Result Date: 02/01/2022 ?CLINICAL DATA:  Screening. EXAM: DIGITAL SCREENING BILATERAL MAMMOGRAM WITH TOMOSYNTHESIS AND CAD TECHNIQUE: Bilateral screening digital craniocaudal and mediolateral oblique mammograms were obtained. Bilateral screening digital breast tomosynthesis was performed. The images were evaluated with computer-aided detection. COMPARISON:  Previous exam(s). ACR Breast Density Category b: There are scattered areas of fibroglandular density. FINDINGS: There are no findings suspicious for malignancy. IMPRESSION: No mammographic evidence of malignancy. A result letter of this screening mammogram will be mailed directly to the patient. RECOMMENDATION: Screening mammogram in one year. (Code:SM-B-01Y) BI-RADS CATEGORY  1: Negative. Electronically Signed   By: Edwin Cap M.D.   On: 02/01/2022 16:16   ? ? ?Assessment & Plan:  ? ?Loud snoring ?- Patient has symptoms loud snoring, witnessed apnea and daytime sleepiness.  Epworth 4.  BMI 30.  Concern patient could have obstructive sleep apnea, needs home sleep study to evaluate for OSA.  Reviewed risks of untreated sleep apnea including cardiac arrhythmias, stroke, pulmonary hypertension and diabetes.  We also discussed treatment options including weight management, oral appliance, CPAP therapy or referral to ear nose and throat for possible surgical options. Encourage weight loss efforts, side sleeping position and advised patient not to drive if experiencing excessive daytime sleepiness or fatigue.  Follow-up in 4 to 6 weeks after sleep study to review results.  ? ? ? ? ?Glenford Bayley, NP ?02/11/2022 ? ?

## 2022-02-11 NOTE — Progress Notes (Signed)
Reviewed and agree with assessment/plan. ? ? ?Coralyn Helling, MD ?[facility] ?02/11/2022, 10:59 AM ?Pager:  [phone] ? ?

## 2022-02-11 NOTE — Patient Instructions (Signed)
Untreated sleep apnea but due to high risk for cardiac arrhythmias, stroke, pulmonary hypertension or diabetes ? ?Treatment options include weight management, oral appliance, CPAP therapy or referral to ENT for possible surgical options ? ?Recommendations ?Do not drive experiencing excessive daytime sleepiness or fatigue ?Focus on side sleeping position or elevate head of bed 30 degrees in her back ?Continue to work on weight loss efforts ? ?Orders ?Sleep study re: snoring/witnessed apnea ? ?Follow-up ?Please call our office to schedule follow-up visit with Beth NP once you have completed home sleep study to review results and treatment options if needed ? ?Sleep Apnea ?Sleep apnea affects breathing during sleep. It causes breathing to stop for 10 seconds or more, or to become shallow. People with sleep apnea usually snore loudly. ?It can also increase the risk of: ?Heart attack. ?Stroke. ?Being very overweight (obese). ?Diabetes. ?Heart failure. ?Irregular heartbeat. ?High blood pressure. ?The goal of treatment is to help you breathe normally again. ?What are the causes? ? ?The most common cause of this condition is a collapsed or blocked airway. ?There are three kinds of sleep apnea: ?Obstructive sleep apnea. This is caused by a blocked or collapsed airway. ?Central sleep apnea. This happens when the brain does not send the right signals to the muscles that control breathing. ?Mixed sleep apnea. This is a combination of obstructive and central sleep apnea. ?What increases the risk? ?Being overweight. ?Smoking. ?Having a small airway. ?Being older. ?Being female. ?Drinking alcohol. ?Taking medicines to calm yourself (sedatives or tranquilizers). ?Having family members with the condition. ?Having a tongue or tonsils that are larger than normal. ?What are the signs or symptoms? ?Trouble staying asleep. ?Loud snoring. ?Headaches in the morning. ?Waking up gasping. ?Dry mouth or sore throat in the morning. ?Being sleepy  or tired during the day. ?If you are sleepy or tired during the day, you may also: ?Not be able to focus your mind (concentrate). ?Forget things. ?Get angry a lot and have mood swings. ?Feel sad (depressed). ?Have changes in your personality. ?Have less interest in sex, if you are female. ?Be unable to have an erection, if you are female. ?How is this treated? ? ?Sleeping on your side. ?Using a medicine to get rid of mucus in your nose (decongestant). ?Avoiding the use of alcohol, medicines to help you relax, or certain pain medicines (narcotics). ?Losing weight, if needed. ?Changing your diet. ?Quitting smoking. ?Using a machine to open your airway while you sleep, such as: ?An oral appliance. This is a mouthpiece that shifts your lower jaw forward. ?A CPAP device. This device blows air through a mask when you breathe out (exhale). ?An EPAP device. This has valves that you put in each nostril. ?A BIPAP device. This device blows air through a mask when you breathe in (inhale) and breathe out. ?Having surgery if other treatments do not work. ?Follow these instructions at home: ?Lifestyle ?Make changes that your doctor recommends. ?Eat a healthy diet. ?Lose weight if needed. ?Avoid alcohol, medicines to help you relax, and some pain medicines. ?Do not smoke or use any products that contain nicotine or tobacco. If you need help quitting, ask your doctor. ?General instructions ?Take over-the-counter and prescription medicines only as told by your doctor. ?If you were given a machine to use while you sleep, use it only as told by your doctor. ?If you are having surgery, make sure to tell your doctor you have sleep apnea. You may need to bring your device with you. ?Keep all follow-up  visits. ?Contact a doctor if: ?The machine that you were given to use during sleep bothers you or does not seem to be working. ?You do not get better. ?You get worse. ?Get help right away if: ?Your chest hurts. ?You have trouble breathing in  enough air. ?You have an uncomfortable feeling in your back, arms, or stomach. ?You have trouble talking. ?One side of your body feels weak. ?A part of your face is hanging down. ?These symptoms may be an emergency. Get help right away. Call your local emergency services (911 in the [place] ?Do not wait to see if the symptoms will go away. ?Do not drive yourself to the hospital. ?Summary ?This condition affects breathing during sleep. ?The most common cause is a collapsed or blocked airway. ?The goal of treatment is to help you breathe normally while you sleep. ?This information is not intended to replace advice given to you by your health care provider. Make sure you discuss any questions you have with your health care provider. ?Document Revised: 05/05/2021 Document Reviewed: 09/04/2020 ?Elsevier Patient Education ? 2023 [employer]vier Inc[employer] ? ?

## 2022-02-11 NOTE — Assessment & Plan Note (Signed)
-   Patient has symptoms loud snoring, witnessed apnea and daytime sleepiness.  Epworth 4.  BMI 30.  Concern patient could have obstructive sleep apnea, needs home sleep study to evaluate for OSA.  Reviewed risks of untreated sleep apnea including cardiac arrhythmias, stroke, pulmonary hypertension and diabetes.  We also discussed treatment options including weight management, oral appliance, CPAP therapy or referral to ear nose and throat for possible surgical options. Encourage weight loss efforts, side sleeping position and advised patient not to drive if experiencing excessive daytime sleepiness or fatigue.  Follow-up in 4 to 6 weeks after sleep study to review results.  ?

## 2022-02-25 ENCOUNTER — Encounter: Payer: Self-pay | Admitting: *Deleted

## 2022-02-25 ENCOUNTER — Encounter: Payer: Self-pay | Admitting: Gastroenterology

## 2022-02-28 ENCOUNTER — Ambulatory Visit
Admission: RE | Admit: 2022-02-28 | Discharge: 2022-02-28 | Disposition: A | Discharge status: Home / Self Care | Payer: Managed Care, Other (non HMO) | Attending: Gastroenterology | Admitting: Gastroenterology

## 2022-02-28 ENCOUNTER — Ambulatory Visit: Payer: Managed Care, Other (non HMO) | Admitting: Registered Nurse

## 2022-02-28 ENCOUNTER — Encounter
Admission: RE | Disposition: A | Discharge status: Home / Self Care | Payer: Self-pay | Source: Home / Self Care | Attending: Gastroenterology

## 2022-02-28 DIAGNOSIS — R030 Elevated blood-pressure reading, without diagnosis of hypertension: Secondary | ICD-10-CM | POA: Diagnosis not present

## 2022-02-28 DIAGNOSIS — K5730 Diverticulosis of large intestine without perforation or abscess without bleeding: Secondary | ICD-10-CM | POA: Diagnosis not present

## 2022-02-28 DIAGNOSIS — Z1211 Encounter for screening for malignant neoplasm of colon: Secondary | ICD-10-CM | POA: Insufficient documentation

## 2022-02-28 DIAGNOSIS — E669 Obesity, unspecified: Secondary | ICD-10-CM | POA: Insufficient documentation

## 2022-02-28 DIAGNOSIS — E785 Hyperlipidemia, unspecified: Secondary | ICD-10-CM | POA: Diagnosis not present

## 2022-02-28 DIAGNOSIS — Z6830 Body mass index (BMI) 30.0-30.9, adult: Secondary | ICD-10-CM | POA: Insufficient documentation

## 2022-02-28 HISTORY — PX: COLONOSCOPY WITH PROPOFOL: SHX5780

## 2022-02-28 LAB — POCT PREGNANCY, URINE: Preg Test, Ur: NEGATIVE

## 2022-02-28 LAB — GLUCOSE, CAPILLARY: Glucose-Capillary: 105 mg/dL — ABNORMAL HIGH (ref 70–99)

## 2022-02-28 SURGERY — COLONOSCOPY WITH PROPOFOL
Anesthesia: General

## 2022-02-28 MED ORDER — LIDOCAINE HCL (CARDIAC) PF 100 MG/5ML IV SOSY
PREFILLED_SYRINGE | INTRAVENOUS | Status: DC | PRN
  Administered 2022-02-28: 40 mg via INTRAVENOUS

## 2022-02-28 MED ORDER — STERILE WATER FOR IRRIGATION IR SOLN
Status: DC | PRN
  Administered 2022-02-28: 60 mL

## 2022-02-28 MED ORDER — PROPOFOL 10 MG/ML IV BOLUS
INTRAVENOUS | Status: DC | PRN
  Administered 2022-02-28: 100 mg via INTRAVENOUS

## 2022-02-28 MED ORDER — PROPOFOL 500 MG/50ML IV EMUL
INTRAVENOUS | Status: AC
  Filled 2022-02-28: qty 50

## 2022-02-28 MED ORDER — PROPOFOL 500 MG/50ML IV EMUL
INTRAVENOUS | Status: DC | PRN
  Administered 2022-02-28: 150 ug/kg/min via INTRAVENOUS

## 2022-02-28 MED ORDER — DEXMEDETOMIDINE (PRECEDEX) IN NS 20 MCG/5ML (4 MCG/ML) IV SYRINGE
PREFILLED_SYRINGE | INTRAVENOUS | Status: DC | PRN
  Administered 2022-02-28: 12 ug via INTRAVENOUS

## 2022-02-28 MED ORDER — SODIUM CHLORIDE 0.9 % IV SOLN
INTRAVENOUS | Status: DC
  Administered 2022-02-28: 20 mL/h via INTRAVENOUS

## 2022-02-28 NOTE — Anesthesia Procedure Notes (Signed)
Date/Time: 02/28/2022 8:57 AM Performed by: Stormy Fabian, CRNA Pre-anesthesia Checklist: Patient identified, Emergency Drugs available, Suction available and Patient being monitored Patient Re-evaluated:Patient Re-evaluated prior to induction Oxygen Delivery Method: Nasal cannula Induction Type: IV induction Dental Injury: Teeth and Oropharynx as per pre-operative assessment  Comments: Nasal cannula with etCO2 monitoring

## 2022-02-28 NOTE — Anesthesia Preprocedure Evaluation (Signed)
Anesthesia Evaluation  Patient identified by MRN, date of birth, ID band Patient awake    Reviewed: Allergy & Precautions, NPO status , Patient's Chart, lab work & pertinent test results  Airway Mallampati: II  TM Distance: >3 FB Neck ROM: Full    Dental  (+) Teeth Intact   Pulmonary neg pulmonary ROS,    Pulmonary exam normal breath sounds clear to auscultation       Cardiovascular negative cardio ROS Normal cardiovascular exam Rhythm:Regular Rate:Normal     Neuro/Psych negative neurological ROS  negative psych ROS   GI/Hepatic negative GI ROS, Neg liver ROS,   Endo/Other  negative endocrine ROSdiabetes, Well Controlled, Type 2  Renal/GU negative Renal ROS  negative genitourinary   Musculoskeletal negative musculoskeletal ROS (+)   Abdominal (+) + obese,   Peds negative pediatric ROS (+)  Hematology negative hematology ROS (+)   Anesthesia Other Findings Past Medical History: No date: Elevated blood pressure (not hypertension) No date: Gestational diabetes No date: Hyperlipidemia  History reviewed. No pertinent surgical history.  BMI    Body Mass Index: 30.54 kg/m      Reproductive/Obstetrics negative OB ROS                             Anesthesia Physical Anesthesia Plan  ASA: 2  Anesthesia Plan: General   Post-op Pain Management:    Induction: Intravenous  PONV Risk Score and Plan: Propofol infusion and TIVA  Airway Management Planned:   Additional Equipment:   Intra-op Plan:   Post-operative Plan:   Informed Consent: I have reviewed the patients History and Physical, chart, labs and discussed the procedure including the risks, benefits and alternatives for the proposed anesthesia with the patient or authorized representative who has indicated his/her understanding and acceptance.     Dental Advisory Given  Plan Discussed with: CRNA and Surgeon  Anesthesia  Plan Comments:         Anesthesia Quick Evaluation

## 2022-02-28 NOTE — H&P (Signed)
Holly Mood, MD [address], [address], [place], [place], [address] [address], [place], [place], [address] Phone: [phone]  Fax: [phone]  Primary Care Physician:  Doreene Nest, NP   Pre-Procedure History & Physical: HPI:  Holly [name] is a 46 y.o. female is here for an colonoscopy.   Past Medical History:  Diagnosis Date   Elevated blood pressure (not hypertension)    Gestational diabetes    Hyperlipidemia     History reviewed. No pertinent surgical history.  Prior to Admission medications   Medication Sig Start Date End Date Taking? Authorizing Provider  atorvastatin (LIPITOR) 20 MG tablet Take 1 tablet (20 mg total) by mouth daily. for cholesterol. 02/11/22  Yes Doreene Nest, NP  clobetasol (TEMOVATE) 0.05 % external solution Apply 1 application topically 2 (two) times daily. 01/20/21  Yes Doreene Nest, NP  levonorgestrel (MIRENA) 20 MCG/DAY IUD by Intrauterine route.   Yes [provider]  Vitamin D, Ergocalciferol, (DRISDOL) 1.25 MG (50000 UNIT) CAPS capsule Take 1 capsule by mouth once weekly for 12 weeks. 02/11/22  Yes Doreene Nest, NP    Allergies as of 02/09/2022   (No Known Allergies)    Family History  Problem Relation Age of Onset   Arthritis Mother    Hypertension Mother    Diabetes Father    Heart failure Father    Stroke Paternal Grandmother    Diabetes Paternal Grandmother    Colon cancer Paternal Grandmother    Breast cancer Neg Hx     Social History   Socioeconomic History   Marital status: Married    Spouse name: Not on file   Number of children: Not on file   Years of education: Not on file   Highest education level: Not on file  Occupational History   Not on file  Tobacco Use   Smoking status: Never   Smokeless tobacco: Never  Vaping Use   Vaping Use: Never used  Substance and Sexual Activity   Alcohol use: Yes    Alcohol/week: 0.0 standard drinks    Comment: social   Drug  use: No   Sexual activity: Not on file  Other Topics Concern   Not on file  Social History Narrative   Married.   Works in Clinical biochemist.   Three girls.   Lives in [place]   Enjoys relaxing.   Social Determinants of Health   Financial Resource Strain: Not on file  Food Insecurity: Not on file  Transportation Needs: Not on file  Physical Activity: Not on file  Stress: Not on file  Social Connections: Not on file  Intimate Partner Violence: Not on file    Review of Systems: See HPI, otherwise negative ROS  Physical Exam: BP (!) 156/106   Pulse (!) 112   Temp (!) 96.4 F (35.8 C) (Temporal)   Resp 20   Ht 5\' 7"  (1.702 m)   Wt 88.5 kg   SpO2 95%   BMI 30.54 kg/m  General:   Alert,  pleasant and cooperative in NAD Head:  Normocephalic and atraumatic. Neck:  Supple; no masses or thyromegaly. Lungs:  Clear throughout to auscultation, normal respiratory effort.    Heart:  +S1, +S2, Regular rate and rhythm, No edema. Abdomen:  Soft, nontender and nondistended. Normal bowel sounds, without guarding, and without rebound.   Neurologic:  Alert and  oriented x4;  grossly normal neurologically.  Impression/Plan: Holly [name] is here for an colonoscopy to  be performed for Screening colonoscopy average risk   Risks, benefits, limitations, and alternatives regarding  colonoscopy have been reviewed with the patient.  Questions have been answered.  All parties agreeable.   Holly Mood, MD  02/28/2022, 8:49 AM

## 2022-02-28 NOTE — Op Note (Signed)
[facility] Patient Name: Holly [name] Procedure Date: 02/28/2022 8:51 AM MRN: [id number] Account #: [id number] Date of Birth: 1976/01/23 Admit Type: Outpatient Age: 46 Room: [facility] ENDO ROOM 4 Gender: Female Note Status: Finalized Instrument Name: Prentice Docker [id number] Procedure:             Colonoscopy Indications:           Screening for colorectal malignant neoplasm Providers:             Wyline Mood MD, MD Referring MD:          Doreene Nest (Referring MD) Medicines:             Monitored Anesthesia Care Complications:         No immediate complications. Procedure:             Pre-Anesthesia Assessment:                        - Prior to the procedure, a History and Physical was                         performed, and patient medications, allergies and                         sensitivities were reviewed. The patient's tolerance                         of previous anesthesia was reviewed.                        - The risks and benefits of the procedure and the                         sedation options and risks were discussed with the                         patient. All questions were answered and informed                         consent was obtained.                        - ASA Grade Assessment: II - A patient with mild                         systemic disease.                        After obtaining informed consent, the colonoscope was                         passed under direct vision. Throughout the procedure,                         the patient's blood pressure, pulse, and oxygen                         saturations were monitored continuously. The                         Colonoscope was introduced  through the anus and                         advanced to the the cecum, identified by the                         appendiceal orifice. The colonoscopy was performed                         with ease. The patient tolerated the procedure well.                          The quality of the bowel preparation was excellent. Findings:      The perianal and digital rectal examinations were normal.      Multiple small-mouthed diverticula were found in the sigmoid colon.      The exam was otherwise without abnormality on direct and retroflexion       views. Impression:            - Diverticulosis in the sigmoid colon.                        - The examination was otherwise normal on direct and                         retroflexion views.                        - No specimens collected. Recommendation:        - Discharge patient to home (with escort).                        - Resume previous diet.                        - Continue present medications.                        - Repeat colonoscopy in 10 years for screening                         purposes. Procedure Code(s):     --- Professional ---                        [phone], Colonoscopy, flexible; diagnostic, including                         collection of specimen(s) by brushing or washing, when                         performed (separate procedure) Diagnosis Code(s):     --- Professional ---                        Z12.11, Encounter for screening for malignant neoplasm                         of colon                        K57.30, Diverticulosis of large intestine without  perforation or abscess without bleeding CPT copyright 2019 American Medical Association. All rights reserved. The codes documented in this report are preliminary and upon coder review may  be revised to meet current compliance requirements. Wyline MoodKiran Cailey Trigueros, MD Wyline MoodKiran Elfriede Bonini MD, MD 02/28/2022 9:13:34 AM This report has been signed electronically. Number of Addenda: 0 Note Initiated On: 02/28/2022 8:51 AM Scope Withdrawal Time: 0 hours 6 minutes 56 seconds  Total Procedure Duration: 0 hours 9 minutes 51 seconds  Estimated Blood Loss:  Estimated blood loss: none.      [facility]

## 2022-02-28 NOTE — Anesthesia Postprocedure Evaluation (Signed)
Anesthesia Post Note  Patient: [employer]  Procedure(s) Performed: COLONOSCOPY WITH PROPOFOL  Patient location during evaluation: PACU Anesthesia Type: General Level of consciousness: awake and oriented Pain management: satisfactory to patient Vital Signs Assessment: post-procedure vital signs reviewed and stable Respiratory status: respiratory function stable and nonlabored ventilation Cardiovascular status: stable Anesthetic complications: no   No notable events documented.   Last Vitals:  Vitals:   02/28/22 0929 02/28/22 0939  BP: 105/87 (!) 111/93  Pulse: 89 90  Resp: 15 13  Temp:    SpO2: 100% 100%    Last Pain:  Vitals:   02/28/22 0939  TempSrc:   PainSc: 0-No pain                 VAN STAVEREN,Delena Casebeer

## 2022-02-28 NOTE — Transfer of Care (Signed)
Immediate Anesthesia Transfer of Care Note  Patient: [facility]  Procedure(s) Performed: COLONOSCOPY WITH PROPOFOL  Patient Location: PACU and Endoscopy Unit  Anesthesia Type:General  Level of Consciousness: awake, drowsy and patient cooperative  Airway & Oxygen Therapy: Patient Spontanous Breathing  Post-op Assessment: Report given to RN and Post -op Vital signs reviewed and stable  Post vital signs: Reviewed and stable  Last Vitals:  Vitals Value Taken Time  BP 94/59 (69) 02/28/22 0919  Temp    Pulse 104 02/28/22 0919  Resp 19 02/28/22 0919  SpO2 100 % 02/28/22 0919  Vitals shown include unvalidated device data.  Last Pain:  Vitals:   02/28/22 0831  TempSrc: Temporal  PainSc: 0-No pain         Complications: No notable events documented.

## 2022-03-01 ENCOUNTER — Encounter: Payer: Self-pay | Admitting: Gastroenterology

## 2022-03-16 ENCOUNTER — Ambulatory Visit: Payer: Managed Care, Other (non HMO)

## 2022-03-16 DIAGNOSIS — G4733 Obstructive sleep apnea (adult) (pediatric): Secondary | ICD-10-CM | POA: Diagnosis not present

## 2022-03-16 DIAGNOSIS — R0683 Snoring: Secondary | ICD-10-CM

## 2022-03-21 DIAGNOSIS — G4733 Obstructive sleep apnea (adult) (pediatric): Secondary | ICD-10-CM

## 2022-03-23 ENCOUNTER — Telehealth: Payer: Self-pay | Admitting: Primary Care

## 2022-03-23 NOTE — Telephone Encounter (Signed)
Patient needs either virtual visit or an office visit to review home sleep study results which showed severe obstructive sleep apnea.

## 2022-03-23 NOTE — Telephone Encounter (Signed)
Spoke to patient and scheduled mychart visit 03/24/2022 at 11:00. She voiced her understanding and had no further questions.  Nothing further needed.

## 2022-03-24 ENCOUNTER — Telehealth (INDEPENDENT_AMBULATORY_CARE_PROVIDER_SITE_OTHER): Payer: Managed Care, Other (non HMO) | Admitting: Primary Care

## 2022-03-24 DIAGNOSIS — G4733 Obstructive sleep apnea (adult) (pediatric): Secondary | ICD-10-CM | POA: Diagnosis not present

## 2022-03-24 DIAGNOSIS — R0683 Snoring: Secondary | ICD-10-CM

## 2022-03-24 NOTE — Patient Instructions (Signed)
Home sleep study showed evidence of severe obstructive sleep apnea, you had on average 62 events an hour.  Oxygen dropped to 64% with an average of 90%  Recommending CPAP therapy.  He will need an in lab CPAP titration study to determine correct pressure settings and potential need for oxygen  When she receives CPAP please aim to wear every night for minimum of 4 to 6 hours or longer Do not drive experiencing excessive daytime sleepiness or fatigue Do not drink alcohol in excess or take sedating medications prior to bedtime as this can worsen obstructive sleep apnea  Follow-up You will need a follow-up in office 31 to 90 days after starting CPAP therapy for compliance check (we have placed a recall for 3 months)  CPAP and BIPAP Information CPAP and BIPAP are methods that use air pressure to keep your airways open and to help you breathe well. CPAP and BIPAP use different amounts of pressure. Your health care provider will tell you whether CPAP or BIPAP would be more helpful for you. CPAP stands for "continuous positive airway pressure." With CPAP, the amount of pressure stays the same while you breathe in (inhale) and out (exhale). BIPAP stands for "bi-level positive airway pressure." With BIPAP, the amount of pressure will be higher when you inhale and lower when you exhale. This allows you to take larger breaths. CPAP or BIPAP may be used in the hospital, or your health care provider may want you to use it at home. You may need to have a sleep study before your health care provider can order a machine for you to use at home. What are the advantages? CPAP or BIPAP can be helpful if you have: Sleep apnea. Chronic obstructive pulmonary disease (COPD). Heart failure. Medical conditions that cause muscle weakness, including muscular dystrophy or amyotrophic lateral sclerosis (ALS). Other problems that cause breathing to be shallow, weak, abnormal, or difficult. CPAP and BIPAP are most commonly  used for obstructive sleep apnea (OSA) to keep the airways from collapsing when the muscles relax during sleep. What are the risks? Generally, this is a safe treatment. However, problems may occur, including: Irritated skin or skin sores if the mask does not fit properly. Dry or stuffy nose or nosebleeds. Dry mouth. Feeling gassy or bloated. Sinus or lung infection if the equipment is not cleaned properly. When should CPAP or BIPAP be used? In most cases, the mask only needs to be worn during sleep. Generally, the mask needs to be worn throughout the night and during any daytime naps. People with certain medical conditions may also need to wear the mask at other times, such as when they are awake. Follow instructions from your health care provider about when to use the machine. What happens during CPAP or BIPAP?  Both CPAP and BIPAP are provided by a small machine with a flexible plastic tube that attaches to a plastic mask that you wear. Air is blown through the mask into your nose or mouth. The amount of pressure that is used to blow the air can be adjusted on the machine. Your health care provider will set the pressure setting and help you find the best mask for you. Tips for using the mask Because the mask needs to be snug, some people feel trapped or closed-in (claustrophobic) when first using the mask. If you feel this way, you may need to get used to the mask. One way to do this is to hold the mask loosely over your nose or  mouth and then gradually apply the mask more snugly. You can also gradually increase the amount of time that you use the mask. Masks are available in various types and sizes. If your mask does not fit well, talk with your health care provider about getting a different one. Some common types of masks include: Full face masks, which fit over the mouth and nose. Nasal masks, which fit over the nose. Nasal pillow or prong masks, which fit into the nostrils. If you are using  a mask that fits over your nose and you tend to breathe through your mouth, a chin strap may be applied to help keep your mouth closed. Use a skin barrier to protect your skin as told by your health care provider. Some CPAP and BIPAP machines have alarms that may sound if the mask comes off or develops a leak. If you have trouble with the mask, it is very important that you talk with your health care provider about finding a way to make the mask easier to tolerate. Do not stop using the mask. There could be a negative impact on your health if you stop using the mask. Tips for using the machine Place your CPAP or BIPAP machine on a secure table or stand near an electrical outlet. Know where the on/off switch is on the machine. Follow instructions from your health care provider about how to set the pressure on your machine and when you should use it. Do not eat or drink while the CPAP or BIPAP machine is on. Food or fluids could get pushed into your lungs by the pressure of the CPAP or BIPAP. For home use, CPAP and BIPAP machines can be rented or purchased through home health care companies. Many different brands of machines are available. Renting a machine before purchasing may help you find out which particular machine works well for you. Your health insurance company may also decide which machine you may get. Keep the CPAP or BIPAP machine and attachments clean. Ask your health care provider for specific instructions. Check the humidifier if you have a dry stuffy nose or nosebleeds. Make sure it is working correctly. Follow these instructions at home: Take over-the-counter and prescription medicines only as told by your health care provider. Ask if you can take sinus medicine if your sinuses are blocked. Do not use any products that contain nicotine or tobacco. These products include cigarettes, chewing tobacco, and vaping devices, such as e-cigarettes. If you need help quitting, ask your health care  provider. Keep all follow-up visits. This is important. Contact a health care provider if: You have redness or pressure sores on your head, face, mouth, or nose from the mask or head gear. You have trouble using the CPAP or BIPAP machine. You cannot tolerate wearing the CPAP or BIPAP mask. Someone tells you that you snore even when wearing your CPAP or BIPAP. Get help right away if: You have trouble breathing. You feel confused. Summary CPAP and BIPAP are methods that use air pressure to keep your airways open and to help you breathe well. If you have trouble with the mask, it is very important that you talk with your health care provider about finding a way to make the mask easier to tolerate. Do not stop using the mask. There could be a negative impact to your health if you stop using the mask. Follow instructions from your health care provider about when to use the machine. This information is not intended to replace advice  given to you by your health care provider. Make sure you discuss any questions you have with your health care provider. Document Revised: 05/05/2021 Document Reviewed: 09/04/2020 Elsevier Patient Education  [place].

## 2022-03-24 NOTE — Progress Notes (Signed)
Virtual Visit via Video Note  I connected with Holly [name] on 03/24/22 at 11:00 AM EDT by a video enabled telemedicine application and verified that I am speaking with the correct person using two identifiers.  Location: Patient: Home Provider: Office    I discussed the limitations of evaluation and management by telemedicine and the availability of in person appointments. The patient expressed understanding and agreed to proceed.  History of Present Illness: 46 year old female, never smoked.  Past medical history significant for elevated blood pressure reading, prediabetes, hyperlipidemia, vitamin D deficiency, snoring.   Previous [facility] pulmonary encounter: 02/11/2022 Patient presents today for sleep consult.  Patient has symptoms of loud snoring, witnessed apnea, restless sleep and daytime sleepiness. Husband has noticed she stops breathing while sleeping. Daytime sleepiness is not impacting her daily life. 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 Typical bedtime is between 10 PM and 11 PM.  On average it takes her 30 minutes to fall asleep.  She wakes up 1-2 times a night.  She starts her day at 6 AM.  Weight has changed 5 to 10 pounds over the last 2 years.  No prior sleep study.  She does not currently use CPAP or oxygen. Denies narcolepsy, cataplexy or sleep walking. Epworth score 4.   03/24/2022- Interim hx Patient contacted today via video visit to review home sleep study results.  Patient had a home sleep study on 03/16/2022 that showed evidence of severe obstructive sleep apnea, AHI 62.4 an hour with SPO2 low 64% (average 90%).  Discussed results of sleep study, risks of untreated sleep apnea and treatment options.  Due to severity of sleep apnea we recommend patient be started on CPAP.  She will need an CPAP titration study to determine correct pressure settings and potential need for oxygen.   Observations/Objective:  Appears well without overt respiratory  symptoms  Assessment and Plan:  Severe OSA - 03/16/2022 HST>> AHI 62.4/HR with SPO2 low 64% - Reviewed sleep study results, risks of untreated sleep apnea and treatment options - Needs CPAP titration study - Encourage patient work on weight loss efforts. Focus on side sleeping position or elevate head of bed at night. Advised patient avoid sedating medication or excessive alcohol prior to bedtime and not drive experiencing excessive daytime sleepiness or fatigue.  Follow Up Instructions:  - Patient will need a follow-up visit 31 to 90 days after starting CPAP therapy (3 month recall placed)   I discussed the assessment and treatment plan with the patient. The patient was provided an opportunity to ask questions and all were answered. The patient agreed with the plan and demonstrated an understanding of the instructions.   The patient was advised to call back or seek an in-person evaluation if the symptoms worsen or if the condition fails to improve as anticipated.  I provided 22 minutes of non-face-to-face time during this encounter.   Glenford Bayley, NP

## 2022-03-24 NOTE — Progress Notes (Signed)
Reviewed and agree with assessment/plan.   Coralyn Helling, MD [facility] Pulmonary/Critical Care 03/24/2022, 11:44 AM Pager:  [phone]

## 2022-04-05 ENCOUNTER — Telehealth (INDEPENDENT_AMBULATORY_CARE_PROVIDER_SITE_OTHER): Payer: Managed Care, Other (non HMO) | Admitting: Primary Care

## 2022-04-05 ENCOUNTER — Encounter: Payer: Self-pay | Admitting: Primary Care

## 2022-04-05 VITALS — Ht 67.0 in | Wt 195.0 lb

## 2022-04-05 DIAGNOSIS — H1033 Unspecified acute conjunctivitis, bilateral: Secondary | ICD-10-CM | POA: Diagnosis not present

## 2022-04-05 MED ORDER — ERYTHROMYCIN 5 MG/GM OP OINT: 1.0000 | TOPICAL_OINTMENT | Freq: Two times a day (BID) | OPHTHALMIC | 0 refills | Status: AC

## 2022-04-05 NOTE — Telephone Encounter (Signed)
Patient evaluated.  

## 2022-04-05 NOTE — Telephone Encounter (Signed)
Called patient added to virtual today

## 2022-05-06 ENCOUNTER — Ambulatory Visit (HOSPITAL_BASED_OUTPATIENT_CLINIC_OR_DEPARTMENT_OTHER): Payer: Managed Care, Other (non HMO) | Attending: Primary Care | Admitting: Pulmonary Disease

## 2022-05-06 DIAGNOSIS — R0683 Snoring: Secondary | ICD-10-CM

## 2022-05-06 DIAGNOSIS — G4733 Obstructive sleep apnea (adult) (pediatric): Secondary | ICD-10-CM | POA: Insufficient documentation

## 2022-05-11 ENCOUNTER — Telehealth: Payer: Self-pay | Admitting: Primary Care

## 2022-05-11 DIAGNOSIS — G4733 Obstructive sleep apnea (adult) (pediatric): Secondary | ICD-10-CM

## 2022-05-11 NOTE — Procedures (Signed)
Patient Name: Holly [name], Holly [name] Date: 05/06/2022 Gender: Female D.O.B: 10-Dec-1975 Age (years): 46 Referring Provider: Ames Dura NP Height (inches): 68 Interpreting Physician: Cyril Mourning MD, ABSM Weight (lbs): 190 RPSGT: Cherylann Parr BMI: 29 MRN: [id number] Neck Size: 15.00 <br> <br> CLINICAL INFORMATION The patient is referred for a CPAP titration to treat sleep apnea.    03/16/2022 HST>> AHI 62.4/HR with SPO2 low 64%  SLEEP STUDY TECHNIQUE As per the AASM Manual for the Scoring of Sleep and Associated Events v2.3 (April 2016) with a hypopnea requiring 4% desaturations.  The channels recorded and monitored were frontal, central and occipital EEG, electrooculogram (EOG), submentalis EMG (chin), nasal and oral airflow, thoracic and abdominal wall motion, anterior tibialis EMG, snore microphone, electrocardiogram, and pulse oximetry. Continuous positive airway pressure (CPAP) was initiated at the beginning of the study and titrated to treat sleep-disordered breathing.  MEDICATIONS Medications self-administered by patient taken the night of the study : N/A  TECHNICIAN COMMENTS Comments added by technician: Patient had difficulty initiating sleep.  RESPIRATORY PARAMETERS Optimal PAP Pressure (cm): 11 AHI at Optimal Pressure (/hr): 0 Overall Minimal O2 (%): 80.0 Supine % at Optimal Pressure (%): 0 Minimal O2 at Optimal Pressure (%): 95.0   SLEEP ARCHITECTURE The study was initiated at 10:24:05 PM and ended at 4:41:56 AM.  Sleep onset time was 89.7 minutes and the sleep efficiency was 70.1%%. The total sleep time was 265 minutes.  The patient spent 2.6%% of the night in stage N1 sleep, 53.2%% in stage N2 sleep, 0.0%% in stage N3 and 44.2% in REM.Stage REM latency was 66.0 minutes  Wake after sleep onset was 23.2. Alpha intrusion was absent. Supine sleep was 20.75%.  CARDIAC DATA The 2 lead EKG demonstrated sinus rhythm. The mean heart rate was 87.6 beats per minute.  Other EKG findings include: None.   LEG MOVEMENT DATA The total Periodic Limb Movements of Sleep (PLMS) were 0. The PLMS index was 0.0. A PLMS index of <15 is considered normal in adults.  IMPRESSIONS - The optimal PAP pressure was 11 cm of water. - Severe oxygen desaturations were observed during this titration (min O2 = 80.0%). - No snoring was audible during this study. - No cardiac abnormalities were observed during this study. - Clinically significant periodic limb movements were not noted during this study. Arousals associated with PLMs were rare.   DIAGNOSIS - Obstructive Sleep Apnea (G47.33)   RECOMMENDATIONS - Trial of CPAP therapy on 11 cm H2O with a Small size Resmed Full Face AirFit F20 mask and heated humidification. - Avoid alcohol, sedatives and other CNS depressants that may worsen sleep apnea and disrupt normal sleep architecture. - Sleep hygiene should be reviewed to assess factors that may improve sleep quality. - Weight management and regular exercise should be initiated or continued. - Return to Sleep Clinic for re-evaluation after 4 weeks of therapy    Cyril Mourning MD Board Certified in Sleep medicine

## 2022-05-11 NOTE — Telephone Encounter (Signed)
Please let patient know I received results of titration study, she needs CPAP at pressure 11cm h20. 03/16/2022 HST>> AHI 62.4/HR with SPO2 low 64%  Can we please place an order for new CPAP therapy on 11 cm H2O with a Small size Resmed Full Face AirFit F20 mask and heated humidification  She will need a visit 31-90 days after receiving her CPAP and starts wearing for compliance check. She can either call to schedule this visit or place a recall for 3 months with me.

## 2022-05-12 NOTE — Telephone Encounter (Signed)
Called and spoke with pt letting her know the results of the titration study and recs per BW. Pt verbalized understanding. Pt will call to schedule f/u appt once receiving CPAP. Order placed for cpap start. Nothing further needed.

## 2022-11-13 IMAGING — MG MM DIGITAL SCREENING BILAT W/ TOMO AND CAD
6 of 10 series · 6 of 30 positions shown · non-contrast
Comparison: Previous exam(s).

CLINICAL DATA: Screening.

EXAM:
DIGITAL SCREENING BILATERAL MAMMOGRAM WITH TOMOSYNTHESIS AND CAD
TECHNIQUE: Bilateral screening digital craniocaudal and mediolateral oblique
mammograms were obtained. Bilateral screening digital breast
tomosynthesis was performed. The images were evaluated with
computer-aided detection.

[L MLO synth-2D (1 of 2)]
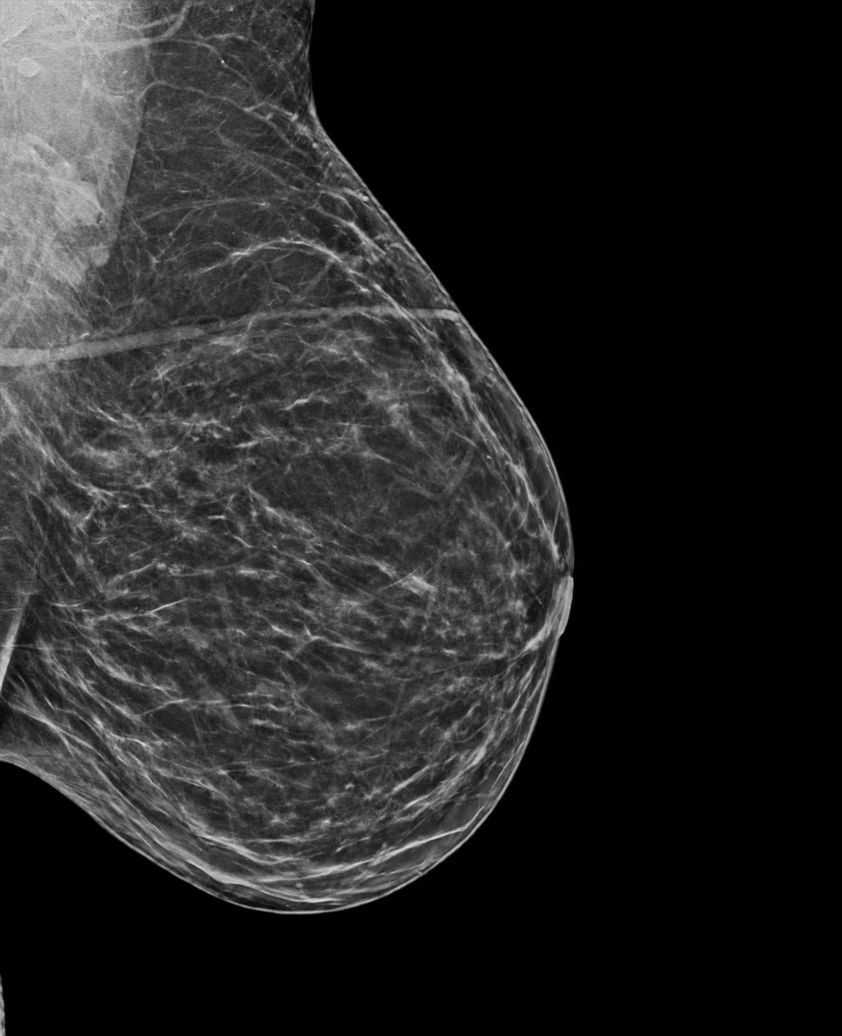

[L MLO synth-2D (2 of 2)]
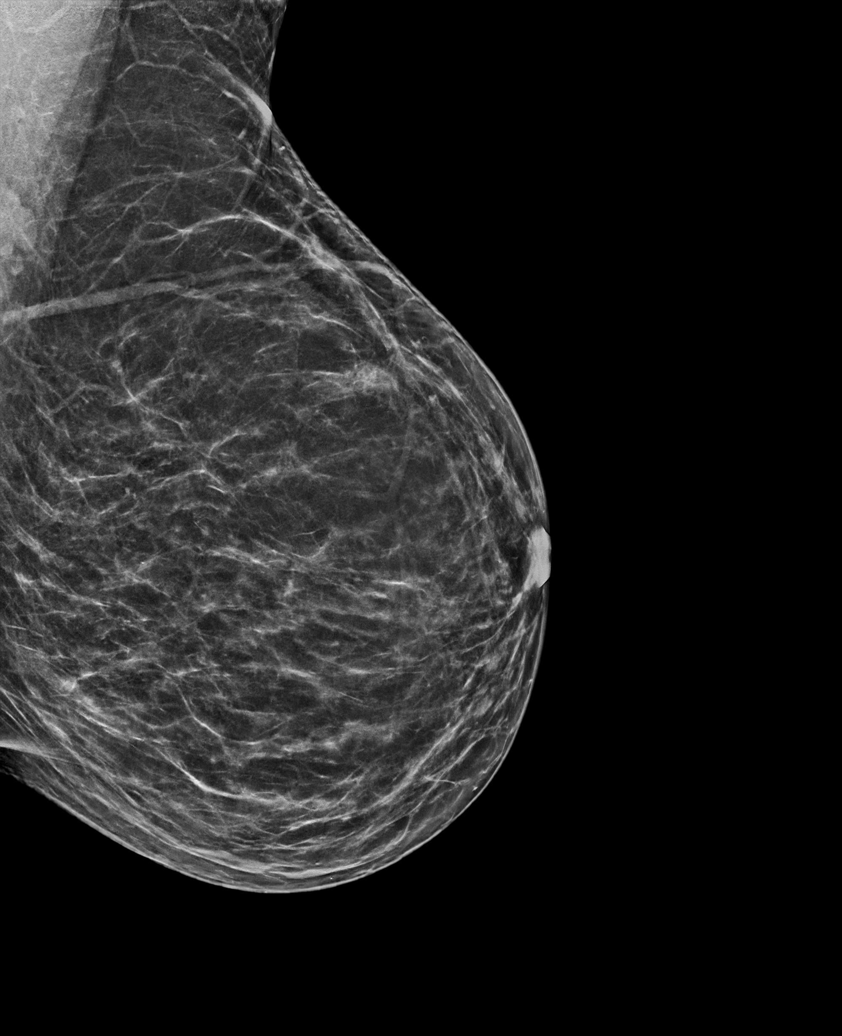

[R MLO synth-2D]
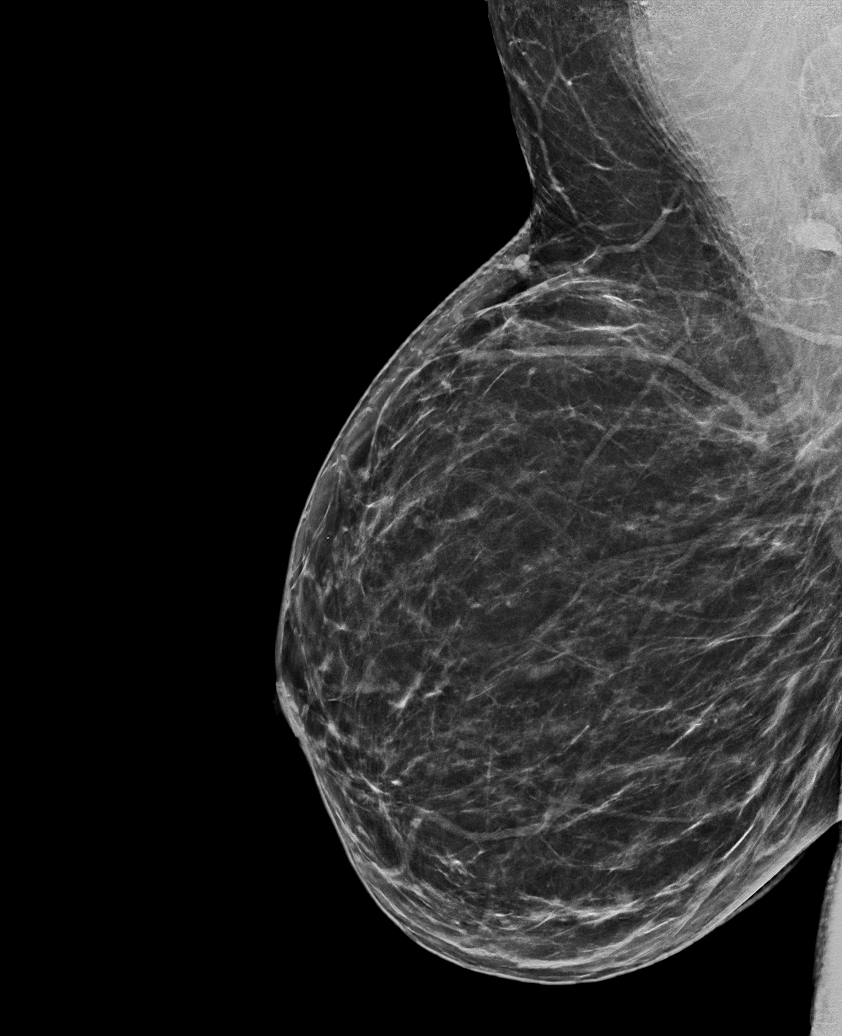

[R CC synth-2D]
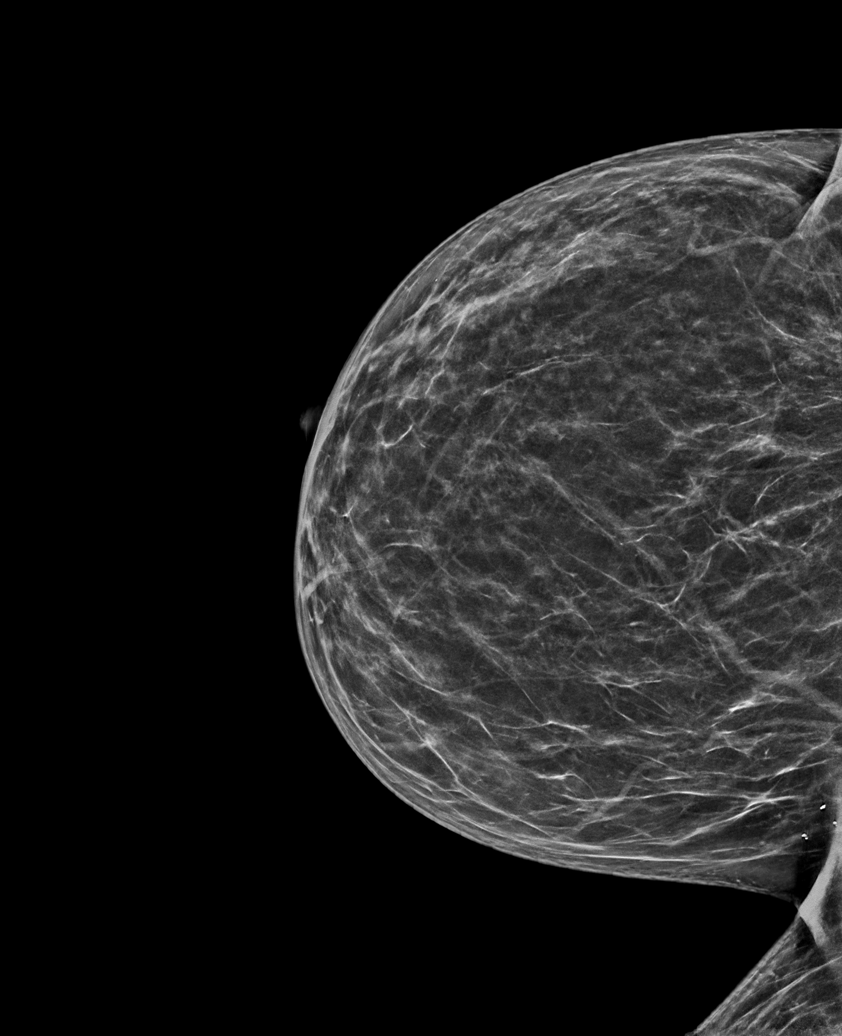

[L CC synth-2D]
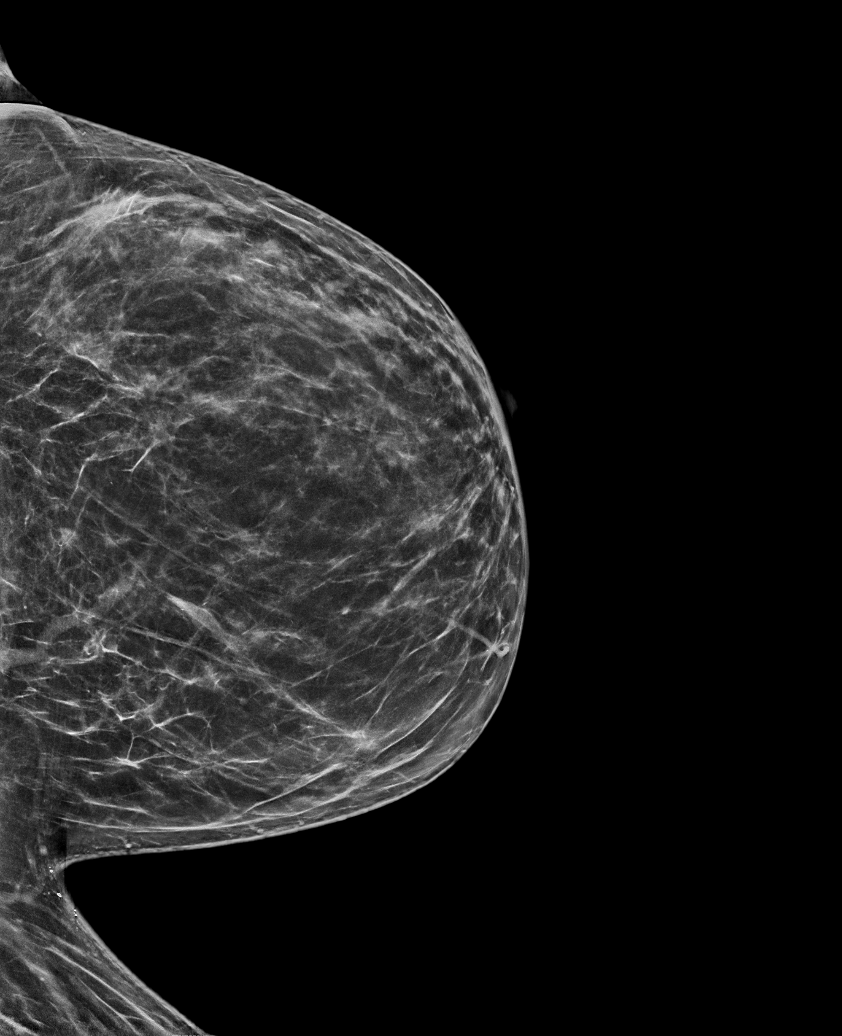

[L CC tomo · tomo slice 39/78.0]
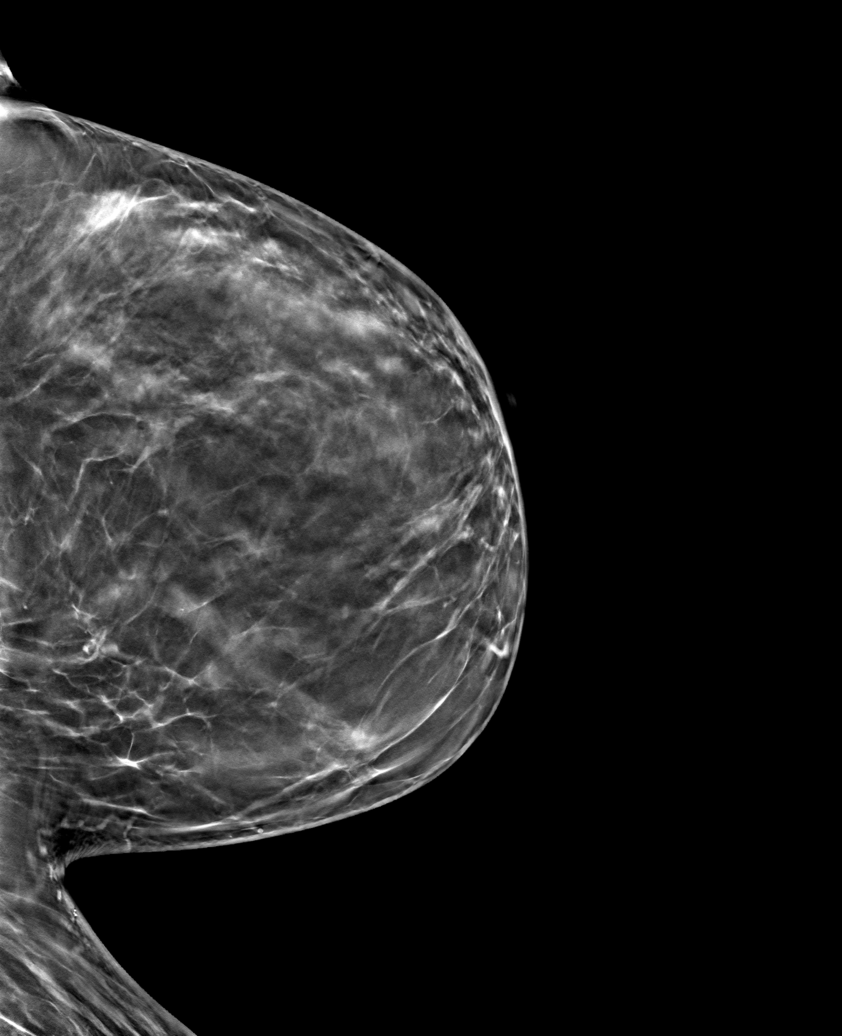

[6 of 30 positions shown; findings below may reference images not displayed]

ACR Breast Density Category b: There are scattered areas of
fibroglandular density.
FINDINGS: There are no findings suspicious for malignancy.
IMPRESSION: No mammographic evidence of malignancy. A result letter of this
screening mammogram will be mailed directly to the patient.

RECOMMENDATION:
Screening mammogram in one year. (Code:51-O-LD2)

BI-RADS CATEGORY  1: Negative.

## 2024-01-20 DIAGNOSIS — Z419 Encounter for procedure for purposes other than remedying health state, unspecified: Secondary | ICD-10-CM | POA: Diagnosis not present

## 2024-02-19 ENCOUNTER — Other Ambulatory Visit: Payer: Self-pay | Admitting: General Practice

## 2024-02-19 DIAGNOSIS — Z419 Encounter for procedure for purposes other than remedying health state, unspecified: Secondary | ICD-10-CM | POA: Diagnosis not present

## 2024-02-19 DIAGNOSIS — Z1231 Encounter for screening mammogram for malignant neoplasm of breast: Secondary | ICD-10-CM

## 2024-03-21 ENCOUNTER — Ambulatory Visit
Admission: RE | Admit: 2024-03-21 | Discharge: 2024-03-21 | Disposition: A | Discharge status: Home / Self Care | Source: Ambulatory Visit

## 2024-03-21 DIAGNOSIS — Z1231 Encounter for screening mammogram for malignant neoplasm of breast: Secondary | ICD-10-CM

## 2024-03-21 DIAGNOSIS — Z419 Encounter for procedure for purposes other than remedying health state, unspecified: Secondary | ICD-10-CM | POA: Diagnosis not present

## 2024-03-25 ENCOUNTER — Encounter: Admitting: General Practice

## 2024-03-26 ENCOUNTER — Ambulatory Visit: Payer: Self-pay | Admitting: General Practice

## 2024-03-26 ENCOUNTER — Encounter: Admitting: Primary Care

## 2024-04-20 DIAGNOSIS — Z419 Encounter for procedure for purposes other than remedying health state, unspecified: Secondary | ICD-10-CM | POA: Diagnosis not present

## 2024-04-24 ENCOUNTER — Encounter: Admitting: Primary Care

## 2024-05-21 DIAGNOSIS — Z419 Encounter for procedure for purposes other than remedying health state, unspecified: Secondary | ICD-10-CM | POA: Diagnosis not present

## 2024-05-22 ENCOUNTER — Encounter: Admitting: Primary Care

## 2024-06-21 DIAGNOSIS — Z419 Encounter for procedure for purposes other than remedying health state, unspecified: Secondary | ICD-10-CM | POA: Diagnosis not present

## 2024-09-20 DIAGNOSIS — Z419 Encounter for procedure for purposes other than remedying health state, unspecified: Secondary | ICD-10-CM | POA: Diagnosis not present
